# Patient Record
Sex: Female | Born: 1945 | Race: White | Hispanic: No | Smoking: Former smoker
Health system: Southern US, Community
[De-identification: ages and names within clinical notes are randomized; demographics above are authoritative.]

## PROBLEM LIST (undated history)

## (undated) DIAGNOSIS — M810 Age-related osteoporosis without current pathological fracture: Secondary | ICD-10-CM

## (undated) DIAGNOSIS — F419 Anxiety disorder, unspecified: Secondary | ICD-10-CM

## (undated) DIAGNOSIS — H269 Unspecified cataract: Secondary | ICD-10-CM

## (undated) DIAGNOSIS — I10 Essential (primary) hypertension: Secondary | ICD-10-CM

## (undated) DIAGNOSIS — R011 Cardiac murmur, unspecified: Secondary | ICD-10-CM

## (undated) DIAGNOSIS — G500 Trigeminal neuralgia: Secondary | ICD-10-CM

## (undated) DIAGNOSIS — K219 Gastro-esophageal reflux disease without esophagitis: Secondary | ICD-10-CM

## (undated) DIAGNOSIS — T7840XA Allergy, unspecified, initial encounter: Secondary | ICD-10-CM

## (undated) DIAGNOSIS — Z5189 Encounter for other specified aftercare: Secondary | ICD-10-CM

## (undated) HISTORY — PX: TONSILECTOMY, ADENOIDECTOMY, BILATERAL MYRINGOTOMY AND TUBES: SHX2538

## (undated) HISTORY — DX: Unspecified cataract: H26.9

## (undated) HISTORY — DX: Anxiety disorder, unspecified: F41.9

## (undated) HISTORY — PX: EYE SURGERY: SHX253

## (undated) HISTORY — DX: Age-related osteoporosis without current pathological fracture: M81.0

## (undated) HISTORY — PX: COSMETIC SURGERY: SHX468

## (undated) HISTORY — DX: Allergy, unspecified, initial encounter: T78.40XA

## (undated) HISTORY — PX: BREAST SURGERY: SHX581

## (undated) HISTORY — DX: Essential (primary) hypertension: I10

## (undated) HISTORY — DX: Cardiac murmur, unspecified: R01.1

## (undated) HISTORY — DX: Trigeminal neuralgia: G50.0

## (undated) HISTORY — DX: Gastro-esophageal reflux disease without esophagitis: K21.9

## (undated) HISTORY — DX: Encounter for other specified aftercare: Z51.89

---

## 1978-07-15 HISTORY — PX: NOSE SURGERY: SHX723

## 1980-07-15 DIAGNOSIS — Z5189 Encounter for other specified aftercare: Secondary | ICD-10-CM

## 1980-07-15 HISTORY — DX: Encounter for other specified aftercare: Z51.89

## 1996-07-15 DIAGNOSIS — G500 Trigeminal neuralgia: Secondary | ICD-10-CM

## 1996-07-15 HISTORY — DX: Trigeminal neuralgia: G50.0

## 1997-04-06 DIAGNOSIS — D229 Melanocytic nevi, unspecified: Secondary | ICD-10-CM

## 1997-04-06 HISTORY — DX: Melanocytic nevi, unspecified: D22.9

## 1998-07-03 ENCOUNTER — Other Ambulatory Visit: Admission: RE | Admit: 1998-07-03 | Discharge: 1998-07-03 | Payer: Self-pay | Admitting: *Deleted

## 1998-07-15 DIAGNOSIS — I10 Essential (primary) hypertension: Secondary | ICD-10-CM

## 1998-07-15 HISTORY — DX: Essential (primary) hypertension: I10

## 1999-07-02 ENCOUNTER — Other Ambulatory Visit: Admission: RE | Admit: 1999-07-02 | Discharge: 1999-07-02 | Payer: Self-pay | Admitting: *Deleted

## 2000-07-09 ENCOUNTER — Other Ambulatory Visit: Admission: RE | Admit: 2000-07-09 | Discharge: 2000-07-09 | Payer: Self-pay | Admitting: *Deleted

## 2002-07-13 ENCOUNTER — Other Ambulatory Visit: Admission: RE | Admit: 2002-07-13 | Discharge: 2002-07-13 | Payer: Self-pay | Admitting: Gynecology

## 2003-07-29 ENCOUNTER — Other Ambulatory Visit: Admission: RE | Admit: 2003-07-29 | Discharge: 2003-07-29 | Payer: Self-pay | Admitting: Gynecology

## 2004-09-26 ENCOUNTER — Encounter (INDEPENDENT_AMBULATORY_CARE_PROVIDER_SITE_OTHER): Payer: Self-pay | Admitting: Specialist

## 2004-09-26 ENCOUNTER — Ambulatory Visit (HOSPITAL_BASED_OUTPATIENT_CLINIC_OR_DEPARTMENT_OTHER): Admission: RE | Admit: 2004-09-26 | Discharge: 2004-09-26 | Payer: Self-pay | Admitting: Gynecology

## 2004-09-26 ENCOUNTER — Ambulatory Visit (HOSPITAL_COMMUNITY): Admission: RE | Admit: 2004-09-26 | Discharge: 2004-09-26 | Payer: Self-pay | Admitting: Gynecology

## 2005-01-16 ENCOUNTER — Ambulatory Visit (HOSPITAL_BASED_OUTPATIENT_CLINIC_OR_DEPARTMENT_OTHER): Admission: RE | Admit: 2005-01-16 | Discharge: 2005-01-16 | Payer: Self-pay | Admitting: Gynecology

## 2005-01-16 ENCOUNTER — Encounter (INDEPENDENT_AMBULATORY_CARE_PROVIDER_SITE_OTHER): Payer: Self-pay | Admitting: Specialist

## 2005-01-16 ENCOUNTER — Observation Stay (HOSPITAL_COMMUNITY): Admission: AD | Admit: 2005-01-16 | Discharge: 2005-01-17 | Payer: Self-pay | Admitting: Gynecology

## 2005-07-15 HISTORY — PX: ABDOMINAL HYSTERECTOMY: SHX81

## 2006-09-10 ENCOUNTER — Other Ambulatory Visit: Admission: RE | Admit: 2006-09-10 | Discharge: 2006-09-10 | Payer: Self-pay | Admitting: Family Medicine

## 2007-11-23 ENCOUNTER — Encounter: Admission: RE | Admit: 2007-11-23 | Discharge: 2007-11-23 | Payer: Self-pay | Admitting: Family Medicine

## 2008-03-06 ENCOUNTER — Emergency Department (HOSPITAL_COMMUNITY): Admission: EM | Admit: 2008-03-06 | Discharge: 2008-03-06 | Payer: Self-pay | Admitting: Emergency Medicine

## 2008-03-13 ENCOUNTER — Encounter: Admission: RE | Admit: 2008-03-13 | Discharge: 2008-03-13 | Payer: Self-pay | Admitting: Neurosurgery

## 2008-03-15 ENCOUNTER — Ambulatory Visit (HOSPITAL_COMMUNITY): Admission: RE | Admit: 2008-03-15 | Discharge: 2008-03-15 | Payer: Self-pay | Admitting: Neurosurgery

## 2010-11-27 NOTE — Op Note (Signed)
Lacey Burke, Lacey Burke                 ACCOUNT NO.:  [id number]   MEDICAL RECORD NO.:  [id number]          PATIENT TYPE:  AMB   LOCATION:  SDS                          FACILITY:  [facility]   PHYSICIAN:  Payton Doughty, M.D.      DATE OF BIRTH:  08-01-1945   DATE OF PROCEDURE:  03/15/2008  DATE OF DISCHARGE:                               OPERATIVE REPORT   ANESTHESIA:  Light sedation.  With anesthesia complications, we were  unable to localize foramina ovale.   PREOPERATIVE DIAGNOSIS:  Trigeminal neuralgia on the right side.   POSTOPERATIVE DIAGNOSIS:  Trigeminal neuralgia on the right side.   PROCEDURE:  Attempted percutaneous rhizotomy.   COMPLICATIONS:  Inability to localize the foramen ovale.   BODY OF TEXT:  This is a 65 year old lady with right V2 and V3 tic, who  has had pain in her upper and lower jaw, was taken to operating room,  had fluoro placed and under fluoroscopic guidance, the foramen ovale was  attempted to be entered with needle.  It was not possible to visualize  the foramen ovale, and it was felt blind stab was unsafe.  The procedure  was therefore terminated.  Further operative options such as  microvascular decompression or gamma knife radiation will be discussed  with the patient.   .           ______________________________  Payton Doughty, M.D.     MWR/MEDQ  D:  03/15/2008  T:  03/16/2008  Job:  [id number]

## 2010-11-30 NOTE — Op Note (Signed)
Lacey Burke, Lacey Burke                 ACCOUNT NO.:  [id number]   MEDICAL RECORD NO.:  [id number]          PATIENT TYPE:  AMB   LOCATION:  NESC                         FACILITY:  [facility]   PHYSICIAN:  Ivor Costa. Farrel Gobble, M.D. DATE OF BIRTH:  02/25/1946   DATE OF PROCEDURE:  09/26/2004  DATE OF DISCHARGE:                                 OPERATIVE REPORT   PREOPERATIVE DIAGNOSIS:  Postmenopausal bleeding.   POSTOPERATIVE DIAGNOSIS:  Postmenopausal bleeding.   PROCEDURES:  1.  Fractional dilatation and curettage.  2.  Hysteroscopy.   SURGEON:  Ivor Costa. Farrel Gobble, M.D.   ANESTHESIA:  MAC with a paracervical block.   ESTIMATED BLOOD LOSS:  Minimal.   IN AND OUT DEFICIT:  3% Sorbitol solution was less than 40 mL.   FINDINGS:  The uterus was anteverted, sounding 7 cm.  There was a right  sidewall defect.  The cavity was otherwise unremarkable and smooth.  Pathology was endometrial curettings, endocervical curettings and uterine  contents.   DESCRIPTION OF PROCEDURE:  The patient was taken to the operating room.  MAC  anesthesia was induced.  Placed in the dorsolithotomy position.  Prepped and  draped in the usual sterile fashion.  A bimanual exam was performed and then  a bivalve speculum was placed in the vagina.  Prior to prepping, the  Laminaria placed the night before was removed.  The cervix was visualized  and stabilized with a single-tooth tenaculum.  Endocervical curettings were  then performed and sent in as a separate specimen.  Paracervical block was  then placed and the cervix was then dilated up to #31 [place].  Afterwards,  the hysteroscope was advanced through the cervix towards the fundus.  There  was polypoid-appearing material in the right-hand side that was removed and  sent as a single specimen.  The remainder of the cavity was unremarkable.  The endometrial curettings were then performed.  The hysteroscope was  replaced in the cavity.  Again no defects were appreciated  and slowly  retracted through the cervix, which was also unremarkable.  The instruments  were then removed.  The patient tolerated the procedure well and then was  transferred back to the PACU in stable condition.     THL/MEDQ  D:  09/26/2004  T:  09/26/2004  Job:  [id number]

## 2010-11-30 NOTE — H&P (Signed)
NAMELORIANN, BOSSERMAN                 ACCOUNT NO.:  [id number]   MEDICAL RECORD NO.:  [id number]          PATIENT TYPE:  AMB   LOCATION:  NESC                         FACILITY:  [facility]   PHYSICIAN:  Ivor Costa. Farrel Gobble, M.D. DATE OF BIRTH:  10-15-1945   DATE OF ADMISSION:  DATE OF DISCHARGE:                                HISTORY & PHYSICAL   CHIEF COMPLAINT:  Postmenopausal bleeding.   HISTORY OF PRESENT ILLNESS:  The patient is a 65 year old postmenopausal  woman who entered menopause in 2004 who now has had three episodes of  postmenopausal bleeding.  Initially was seen in our office in August 2005,  at which point she had had some breast tenderness and light spotting that  had begun that day.  She had a biopsy done which showed proliferative  endometrium with breakdown but no evidence of hyperplasia.  She was noted to  have a thickened endometrium and was treated with progesterone for 3 months.  Despite progesterone therapy, the patient did not have any vaginal bleeding  but her lining thinned out to 2.2.  The patient, unfortunately, has had two  other episodes of vaginal bleeding not associated with Provera.  Her lining  has increased to 3.3.  She does have a small intramural myoma, the impact of  which on the lining was unclear.  Based on that, the patient would like to  proceed with a more definitive evaluation of her postmenopausal bleeding and  is scheduled for a D&C hysteroscopy.   PAST OB/GYN HISTORY:  1.  Spontaneous menopause.  2.  No history of hormone replacement.  3.  She has had two vaginal deliveries.  4.  She has had regular Pap smears.  5.  No menopausal symptoms.   PAST MEDICAL HISTORY:  Negative.   PAST SURGICAL HISTORY:  Mammoplasty in 1993.   MEDICATIONS:  Paxil, MiraLax, metoprolol, plus multiple vitamins.   ALLERGIES:  Negative.   SOCIAL HISTORY:  She is not married.  Social alcohol.  Regular exercise.   PHYSICAL EXAMINATION:  GENERAL:  She is  well-appearing, in no acute  distress.  HEART:  Regular rate.  LUNGS:  Clear to auscultation.  ABDOMEN:  Soft, nontender.  GYNECOLOGIC:  Normal external genitalia. BUS negative.  Cervix is without  gross lesions.  Uterus is mobile, nontender, as were the adnexa.  Rectovaginal exam was deferred.   ASSESSMENT:  Recurrent postmenopausal bleeding with a negative evaluation to  date who presents now for [facility] hysteroscopy.  All questions were addressed.  She will have a laminaria placed the day before.      THL/MEDQ  D:  09/25/2004  T:  09/25/2004  Job:  [id number]

## 2010-11-30 NOTE — H&P (Signed)
Lacey Burke, Lacey Burke                 ACCOUNT NO.:  [id number]   MEDICAL RECORD NO.:  [id number]          PATIENT TYPE:  AMB   LOCATION:  NESC                         FACILITY:  [facility]   PHYSICIAN:  Ivor Costa. Farrel Gobble, M.D. DATE OF BIRTH:  Dec 01, 1945   DATE OF ADMISSION:  DATE OF DISCHARGE:                                HISTORY & PHYSICAL   CHIEF COMPLAINT:  Postmenopausal bleeding.   HISTORY OF PRESENT ILLNESS:  Patient is a 65 year old postmenopausal woman  who entered menopause in 2004, who now has had her fourth episode of  postmenopausal bleeding.  She was initially seen in our office in August,  2005, at which point she had some breast tenderness and some light spotting  that had begun that day.  She had a biopsy which showed proliferative  endometrium with breakdown but no evidence of hyperplasia.  She was noticed  to have a thickened endometrium and was treated with progesterone for three  months.  Despite progesterone therapy, the patient did not have any vaginal  bleeding but her lining thinned out to 2.2.  She unfortunately had two other  episodes of vaginal bleeding, and her lining had increased to 3.3.  That  prompted her to undergo D&C hysteroscopy, which was done in March, 2006,  which showed a benign necrotic polyp.  The patient did well until May, 2006  when she again presented to the office with another episode of  postmenopausal bleeding.  As the patient was status post recent evaluation  of her uterus, she was offered either expected management versus definitive  surgery.  The patient at this point elected to have definitive surgery in  the form of a laparoscopic-assisted vaginal hysterectomy with laparoscopic  bilateral salpingo-oophorectomy because of risk of ovarian cancer.  She is  Ashkenazi Jewish.   PAST OB/GYN HISTORY:  She went into spontaneous menopause, as mentioned, in  2004.  She has no history of hormone replacement.  She has had two vaginal  deliveries.   Regular Pap smears.  No menopausal symptoms.   PAST MEDICAL HISTORY:  Negative.   PAST SURGICAL HISTORY:  Mammoplasty in 2003 as well as hysteroscopies,  mentioned above.   MEDICATIONS:  She is on Paxil, MiraLax, metoprolol, and multivitamin.   ALLERGIES:  Negative.   SOCIAL HISTORY:  Not married.  Social alcohol.  Regular exercise.   PHYSICAL EXAMINATION:  GENERAL:  She is well-appearing in no acute distress.  LUNGS:  Clear to auscultation.  HEART:  Regular rate.  ABDOMEN:  Soft and nontender.  GENITOURINARY:  She has normal external genitalia.  BUS is negative.  Cervix  is without gross lesions.  Uterus is mobile and nontender.  The adnexal and  rectovaginal exam was deferred.   ASSESSMENT:  Recurrent postmenopausal bleeding.   For definitive surgery at this point.  All questions were addressed.  She  will present on the morning of July 5 for surgery.       THL/MEDQ  D:  01/14/2005  T:  01/14/2005  Job:  [id number]

## 2010-11-30 NOTE — Op Note (Signed)
NAMEPHELAN, GOERS                 ACCOUNT NO.:  [id number]   MEDICAL RECORD NO.:  [id number]          PATIENT TYPE:  AMB   LOCATION:  NESC                         FACILITY:  [facility]   PHYSICIAN:  Ivor Costa. Farrel Gobble, M.D. DATE OF BIRTH:  August 15, 1945   DATE OF PROCEDURE:  01/16/2005  DATE OF DISCHARGE:                                 OPERATIVE REPORT   PREOPERATIVE DIAGNOSES:  1.  Postmenopausal bleeding, recurrent.  2.  Uterine fibroids.   POSTOPERATIVE DIAGNOSES:  1.  Postmenopausal bleeding, recurrent.  2.  Uterine fibroids.   PROCEDURE:  Laparoscopic-assisted vaginal hysterectomy with bilateral  salpingo-oophorectomies.   SURGEON:  Ivor Costa. Farrel Gobble, M.D.   ASSISTANTMarcial Pacas P. Fontaine, M.D.   ANESTHESIA:  General.   IV FLUIDS:  1500 cc lactated Ringer's.   ESTIMATED BLOOD LOSS:  50 cc.   URINE OUTPUT:  500 cc clear urine.   FINDINGS:  A subserosal uterine fibroid.  There was right ovarian  enlargement without excrescences.  Normal tubes and left ovary.   COMPLICATIONS:  None.   PATHOLOGY:  Uterus, cervix, tubes, and ovaries.   PROCEDURE:  The patient was taken to the operating room and general  anesthesia was induced.  She was placed in the dorsal lithotomy position and  prepped and draped in the usual sterile fashion.  A bivalve speculum was  placed in the vagina and the cervix was visualized and the uterine  manipulator was placed, after which gloves were changed and attention was  turned to the abdomen where an infraumbilical incision was made with the  scalpel, through which the Veress needle was inserted.  Free flow fluid was  noted.  Opening pressure was 7.  A pneumoperitoneum was then created until  tympany was appreciated by the liver, after which a #10-11 OptiView trocar  was inserted through the infraumbilical port.  Placement in the abdomen was  confirmed.  Two 5-mm ports were made laterally under direct visualization  using these ports.  The pelvis was  inspected.  The patient was placed in  Trendelenburg position.  The IP was identified.  The ureter was identified  markedly inferior bilaterally.  The IP was then cauterized and transected.  The posterior leaf of the broad ligament was similarly cauterized and  transected, the dissection carried through to the round ligaments which were  similarly cauterized and transected.  The anterior leaf of the broad  ligament was then incised sharply, elevated, and the bladder flap was then  created.  This was performed bilaterally.  Because of good visualization, we  were able to take some of the uterine vessels on the left-hand side, again  with cautery and transection.   The surgery was then converted to the vaginal portion.  Her legs were  elevated with careful attention to avoid over-flexion onto the hips.  The  uterine manipulator was removed.  The sterile weighted speculum was placed  in the posterior vagina.  The cervix was grabbed with a single-tooth  tenaculum.  The vaginal mucosa was then injected circumferentially with 1%  lidocaine with epinephrine.  The vaginal  mucosa was scored  circumferentially.  The dissection was carried through anteriorly and  anterior colpotomy was performed.  Similarly, the vaginal mucosa was  dissected inferiorly and the posterior colpotomy was similarly formed.  The  long sterile weighted speculum was then placed in the vagina.  The  uterosacral ligaments were then clamped, transected, and suture ligated with  0 Vicryl.  The cardinal ligaments were similarly transected and sutured with  0 Vicryl.  The dissection carried through until the uterines on the right  were achieved, at which point the specimen was delivered through the vagina.  A sterile sponge was placed in the vagina.  The incision was inspected.  The  short sterile weighted speculum was placed.  The pedicles were felt to be  hemostatic.  The posterior peritoneum was then plicated to the vagina  from  uterosacral ligament to uterosacral ligament.  The vagina was then plicated  anteriorly to posteriorly with 0 Vicryl.  The vagina was irrigated and felt  to be hemostatic.  The pneumoperitoneum was then recreated from above.  A  small area of bleeding on the peritoneum was identified and treated after  the pelvis was irrigated.  The pneumoperitoneum was slightly released in  order to confirm that we had achieved hemostasis in the pelvis, which was  assured.  The pelvis continued to be irrigated.  The ports were then removed  under direct visualization.  The infraumbilical fascia was closed with 0  Vicryl with a figure-of-eight.  The infraumbilical skin was closed with 3-0  plain.  All three ports were injected with a total of 10% of 0.25% Marcaine  solution.  The lower ports were reapproximated with tincture of benzoin with  Steri-Strips.  The patient tolerated the procedure well.  She received  cefazolin and gentamicin intraoperatively for mitral valve prolapse  prophylaxis.       THL/MEDQ  D:  01/16/2005  T:  01/16/2005  Job:  [id number]

## 2010-11-30 NOTE — Discharge Summary (Signed)
Lacey Burke, VESSEL                 ACCOUNT NO.:  [id number]   MEDICAL RECORD NO.:  [id number]          PATIENT TYPE:  INP   LOCATION:  1615                         [facility]ACILITY:  [facility]   PHYSICIAN:  Ivor Costa. Farrel Gobble, M.D. DATE OF BIRTH:  02-16-1946   DATE OF ADMISSION:  01/16/2005  DATE OF DISCHARGE:  01/17/2005                                 DISCHARGE SUMMARY   PRINCIPAL DIAGNOSIS:  Recurrent postmenopausal bleeding.   PRINCIPAL PROCEDURE:  Laparoscopic assisted vaginal hysterectomy.   ADDITIONAL PROCEDURE:  Bilateral salpingo-oophorectomy.   HOSPITAL COURSE:  Refer to the dictated H&P. The patient presented the  morning of January 16, 2005 and underwent a laparoscopic assisted vaginal  hysterectomy under general anesthesia with an estimated blood loss of  approximately 50 cc. The patient tolerated the procedure well. She was  extubated in the OR, transferred to the PACU, and then to the postoperative  floor in due fashion. Her postoperative course was unremarkable. At her  postoperative check, the patient was ambulating in the room, voiding freely,  and tolerating regular diet. By the morning of postoperative day #1, she was  ready to go home. She was without any complaints. Her pain was well  controlled. Her heart was regular rate. Her lungs were clear to  auscultation. Abdomen was soft and nontender with bowel sounds. Incisions  were intact. Extremities were nontender. Her postoperative labs:  Her  hemoglobin was 12.6, hematocrit 36.6, platelets 282, white count of 10.6.   The patient was discharged home in stable condition with instructions to use  over-the-counter Motrin as needed. She had been given a prescription for  Lortab earlier. She was instructed to follow up in the office in two weeks.       THL/MEDQ  D:  01/17/2005  T:  01/17/2005  Job:  [id number]

## 2010-12-17 ENCOUNTER — Other Ambulatory Visit: Payer: Self-pay | Admitting: Family Medicine

## 2010-12-17 DIAGNOSIS — I10 Essential (primary) hypertension: Secondary | ICD-10-CM

## 2010-12-26 ENCOUNTER — Ambulatory Visit
Admission: RE | Admit: 2010-12-26 | Discharge: 2010-12-26 | Disposition: A | Discharge status: Home / Self Care | Payer: BC Managed Care – PPO | Source: Ambulatory Visit | Attending: Family Medicine | Admitting: Family Medicine

## 2010-12-26 DIAGNOSIS — I10 Essential (primary) hypertension: Secondary | ICD-10-CM

## 2011-07-02 ENCOUNTER — Ambulatory Visit (INDEPENDENT_AMBULATORY_CARE_PROVIDER_SITE_OTHER): Payer: BC Managed Care – PPO

## 2011-07-02 DIAGNOSIS — R7401 Elevation of levels of liver transaminase levels: Secondary | ICD-10-CM

## 2011-07-02 DIAGNOSIS — S92309A Fracture of unspecified metatarsal bone(s), unspecified foot, initial encounter for closed fracture: Secondary | ICD-10-CM

## 2011-07-12 ENCOUNTER — Other Ambulatory Visit: Payer: Self-pay | Admitting: Internal Medicine

## 2011-07-12 DIAGNOSIS — R7989 Other specified abnormal findings of blood chemistry: Secondary | ICD-10-CM

## 2011-07-15 ENCOUNTER — Other Ambulatory Visit: Payer: Self-pay | Admitting: Internal Medicine

## 2011-07-15 ENCOUNTER — Ambulatory Visit
Admission: RE | Admit: 2011-07-15 | Discharge: 2011-07-15 | Disposition: A | Discharge status: Home / Self Care | Payer: BC Managed Care – PPO | Source: Ambulatory Visit | Attending: Internal Medicine | Admitting: Internal Medicine

## 2011-07-15 DIAGNOSIS — R7989 Other specified abnormal findings of blood chemistry: Secondary | ICD-10-CM

## 2011-08-02 ENCOUNTER — Ambulatory Visit (INDEPENDENT_AMBULATORY_CARE_PROVIDER_SITE_OTHER): Payer: BC Managed Care – PPO

## 2011-08-02 DIAGNOSIS — S92309A Fracture of unspecified metatarsal bone(s), unspecified foot, initial encounter for closed fracture: Secondary | ICD-10-CM

## 2011-08-16 ENCOUNTER — Telehealth: Payer: Self-pay | Admitting: Physician Assistant

## 2011-08-16 NOTE — Telephone Encounter (Signed)
Lacey Burke,  Patients chart is in your box per your request

## 2011-08-16 NOTE — Telephone Encounter (Signed)
Message copied by Kerney Elbe on Fri Aug 16, 2011 11:41 AM ------      Message from: Jonita Albee      Created: Wed Aug 14, 2011  4:03 PM       Please pull chart , put in my box.

## 2011-08-17 NOTE — Telephone Encounter (Signed)
Put this chart in Dr. Ernestene Mention box, per his request

## 2011-09-10 ENCOUNTER — Ambulatory Visit (INDEPENDENT_AMBULATORY_CARE_PROVIDER_SITE_OTHER): Payer: BC Managed Care – PPO | Admitting: Internal Medicine

## 2011-09-10 VITALS — BP 142/96 | HR 64 | Temp 98.1°F | Resp 16 | Ht 60.25 in | Wt 110.0 lb

## 2011-09-10 DIAGNOSIS — R7989 Other specified abnormal findings of blood chemistry: Secondary | ICD-10-CM | POA: Insufficient documentation

## 2011-09-10 DIAGNOSIS — R7401 Elevation of levels of liver transaminase levels: Secondary | ICD-10-CM

## 2011-09-10 DIAGNOSIS — I10 Essential (primary) hypertension: Secondary | ICD-10-CM

## 2011-09-10 DIAGNOSIS — R748 Abnormal levels of other serum enzymes: Secondary | ICD-10-CM

## 2011-09-10 DIAGNOSIS — R945 Abnormal results of liver function studies: Secondary | ICD-10-CM | POA: Insufficient documentation

## 2011-09-10 DIAGNOSIS — G500 Trigeminal neuralgia: Secondary | ICD-10-CM | POA: Insufficient documentation

## 2011-09-10 LAB — HEPATIC FUNCTION PANEL
Bilirubin, Direct: 0.1 mg/dL (ref 0.0–0.3)
Indirect Bilirubin: 0.2 mg/dL (ref 0.0–0.9)
Total Bilirubin: 0.3 mg/dL (ref 0.3–1.2)

## 2011-09-10 LAB — POCT CBC
Hemoglobin: 14.1 g/dL (ref 12.2–16.2)
Lymph, poc: 2.3 (ref 0.6–3.4)
MCH, POC: 30.2 pg (ref 27–31.2)
MCHC: 33.1 g/dL (ref 31.8–35.4)
MID (cbc): 0.5 (ref 0–0.9)
MPV: 8.9 fL (ref 0–99.8)
POC Granulocyte: 4.8 (ref 2–6.9)
POC LYMPH PERCENT: 30.5 %L (ref 10–50)
POC MID %: 6.1 %M (ref 0–12)
RDW, POC: 12.4 %
WBC: 7.5 10*3/uL (ref 4.6–10.2)

## 2011-09-10 LAB — IRON AND TIBC: TIBC: 343 ug/dL (ref 250–470)

## 2011-09-10 NOTE — Progress Notes (Signed)
  Subjective:    Patient ID: Lacey Burke, female    DOB: [birth year], 66 y.o.   MRN: [id number]  HPI Lacey Burke is here for follow up of L 5th metatarsal fracture sustained 06/07/2011.  She feels 100% better and has even started to run lightly without any pain.  She does not want a final xray today.  Lacey Burke also needs to repeat LFTs as we have been following a mild elevation of ALT and AST for ~ 6 months.  She is held ETOH and other meds and is hoping that they will be normal.  She also mentions that she has had thick formed green mucous mainly from left nasal passage but does not feel ill.  She did have a HA yesterday am but none today.   Review of Systems  Constitutional: Negative for fever, chills and fatigue.  HENT: Positive for congestion. Negative for rhinorrhea, postnasal drip and sinus pressure.   Respiratory: Negative for cough.   Gastrointestinal: Negative for nausea, vomiting and abdominal pain.  Musculoskeletal: Negative for myalgias, joint swelling and arthralgias.       Objective:   Physical Exam  Constitutional: She appears well-developed and well-nourished.  HENT:  Right Ear: Tympanic membrane normal.  Left Ear: Tympanic membrane normal.  Nose: No mucosal edema or rhinorrhea. Right sinus exhibits no maxillary sinus tenderness and no frontal sinus tenderness. Left sinus exhibits no maxillary sinus tenderness and no frontal sinus tenderness.  Mouth/Throat: Oropharynx is clear and moist.  Musculoskeletal:       Left foot: Normal. She exhibits no tenderness, no bony tenderness and no swelling.   Pt is abl to balance on left foot for 15 seconds without any pain.  Filed Vitals:   09/10/11 1625  BP: 142/96  Pulse: 64  Temp: 98.1 F (36.7 C)  Resp: 16         Assessment & Plan:   Fracture, Left 5th Metatarsal  Pt declines final xray today.  Reviewed need for final baseline but pt feels that since she is absolutely pain free that she will defer.     Abnormal  Liver Function Tests  Repeat LFTs today.  Also check FE/TIBC, ferritin.  Will draw enough blood to add on AMA, ANA, cooper, PT, and alpha 1 antitrypsin if elevation remains.  Will have her return for PTT since it is frozen.  Sinusitis  Increase use of saline NS.  If no resolution of symptoms and labs are normal consider antibiotic.

## 2011-11-05 ENCOUNTER — Other Ambulatory Visit: Payer: Self-pay | Admitting: Physician Assistant

## 2011-11-06 ENCOUNTER — Telehealth: Payer: Self-pay

## 2011-11-06 MED ORDER — CARBAMAZEPINE 200 MG PO TABS: 200.0000 mg | ORAL_TABLET | Freq: Two times a day (BID) | ORAL | Status: DC

## 2011-11-06 MED ORDER — EPINEPHRINE 0.3 MG/0.3ML IJ DEVI: 0.3000 mg | Freq: Once | INTRAMUSCULAR | Status: DC

## 2011-11-06 MED ORDER — CARBAMAZEPINE 200 MG PO TABS: 200.0000 mg | ORAL_TABLET | ORAL | Status: DC

## 2011-11-06 NOTE — Telephone Encounter (Signed)
Advise pt that rx sent into pharmacy.

## 2011-11-06 NOTE — Telephone Encounter (Signed)
Pt LM on my VM asking for Rx for an Epi-pen and RF on her tegretol. She stated she is a pt of [facility]. Helmut Muster do you want to Rx these for pt?

## 2011-11-06 NOTE — Telephone Encounter (Signed)
Pt is allergic to bees.  She states that she got it from [place] a few years ago.  Chart is at nurses station for review [place] [id number]

## 2011-11-06 NOTE — Telephone Encounter (Signed)
Ok to RF tegretol.  Why does she need Epi Pen? I don't recall her having a history that needed this.  However, I am happy to send that in if she has an allergy. Let me know

## 2011-11-06 NOTE — Telephone Encounter (Signed)
Tell pt epi-pen sent to pharmacy and refill on Tegretol per her request.

## 2011-11-06 NOTE — Telephone Encounter (Signed)
LMOM to call back

## 2011-12-05 ENCOUNTER — Telehealth: Payer: Self-pay

## 2011-12-05 NOTE — Telephone Encounter (Signed)
PATIENT NEEDS Lacey Burke TO HAVE HER TEGRIDOL REFILLED

## 2011-12-06 MED ORDER — CARBAMAZEPINE 200 MG PO TABS: 200.0000 mg | ORAL_TABLET | Freq: Two times a day (BID) | ORAL | Status: DC

## 2011-12-06 NOTE — Telephone Encounter (Signed)
Called pt LMOM to let pt know Rx sent in

## 2011-12-06 NOTE — Telephone Encounter (Signed)
Rx sent, please advise patient

## 2011-12-15 ENCOUNTER — Other Ambulatory Visit: Payer: Self-pay | Admitting: Family Medicine

## 2012-02-01 ENCOUNTER — Ambulatory Visit (INDEPENDENT_AMBULATORY_CARE_PROVIDER_SITE_OTHER): Payer: BC Managed Care – PPO | Admitting: Physician Assistant

## 2012-02-01 VITALS — BP 118/76 | HR 76 | Temp 98.2°F | Resp 16 | Ht 60.0 in | Wt 106.8 lb

## 2012-02-01 DIAGNOSIS — R7402 Elevation of levels of lactic acid dehydrogenase (LDH): Secondary | ICD-10-CM | POA: Diagnosis not present

## 2012-02-01 DIAGNOSIS — M810 Age-related osteoporosis without current pathological fracture: Secondary | ICD-10-CM

## 2012-02-01 DIAGNOSIS — F411 Generalized anxiety disorder: Secondary | ICD-10-CM

## 2012-02-01 DIAGNOSIS — R748 Abnormal levels of other serum enzymes: Secondary | ICD-10-CM

## 2012-02-01 DIAGNOSIS — R7401 Elevation of levels of liver transaminase levels: Secondary | ICD-10-CM

## 2012-02-01 DIAGNOSIS — F419 Anxiety disorder, unspecified: Secondary | ICD-10-CM | POA: Insufficient documentation

## 2012-02-01 LAB — COMPREHENSIVE METABOLIC PANEL
ALT: 32 U/L (ref 0–35)
AST: 25 U/L (ref 0–37)
Albumin: 4.6 g/dL (ref 3.5–5.2)
BUN: 17 mg/dL (ref 6–23)
CO2: 28 mEq/L (ref 19–32)
Calcium: 9.6 mg/dL (ref 8.4–10.5)
Chloride: 104 mEq/L (ref 96–112)
Potassium: 4.5 mEq/L (ref 3.5–5.3)

## 2012-02-01 LAB — POCT CBC
Lymph, poc: 1.7 (ref 0.6–3.4)
MCH, POC: 30.5 pg (ref 27–31.2)
MCHC: 32.2 g/dL (ref 31.8–35.4)
MID (cbc): 0.4 (ref 0–0.9)
MPV: 8.2 fL (ref 0–99.8)
POC MID %: 4.4 %M (ref 0–12)
Platelet Count, POC: 356 10*3/uL (ref 142–424)
WBC: 8.7 10*3/uL (ref 4.6–10.2)

## 2012-02-01 LAB — LIPID PANEL
Cholesterol: 214 mg/dL — ABNORMAL HIGH (ref 0–200)
HDL: 93 mg/dL (ref >39)

## 2012-02-01 NOTE — Progress Notes (Signed)
  Subjective:    Patient ID: Lacey Burke, female    DOB: 1946/04/13, 66 y.o.   MRN: [id number]  HPI Anaiza comes in today to discuss future care.  She has been made aware that I am going to be leaving and wanted to discuss her labs and care.  She is doing very well and has no new concerns.  Her last LFT's were 2/13 and were normal.  She feels that the Tegretol is the cause for the elevation.  She had a "flare" of trigeminal neuralgia in May and has been instructed in the past to increase her dose.  She did this and has since tapered back down to 1 a day.  She is current with her GYN exams and has a bone density test scheduled through GYN.  Her anxiety has been in good control.  She has been able to maintain her exercise and this always helps her.    Review of Systems As noted in HPI, otherwise negative     Objective:   Physical Exam  Constitutional: She is oriented to person, place, and time. She appears well-developed and well-nourished.  Eyes: No scleral icterus.  Cardiovascular: Normal rate and regular rhythm.   Neurological: She is alert and oriented to person, place, and time.  Skin: Skin is warm.     Results for orders placed in visit on 02/01/12  POCT CBC      Component Value Range   WBC 8.7  4.6 - 10.2 K/uL   Lymph, poc 1.7  0.6 - 3.4   POC LYMPH PERCENT 20.0  10 - 50 %L   MID (cbc) 0.4  0 - 0.9   POC MID % 4.4  0 - 12 %M   POC Granulocyte 6.6  2 - 6.9   Granulocyte percent 75.6  37 - 80 %G   RBC 4.79  4.04 - 5.48 M/uL   Hemoglobin 14.6  12.2 - 16.2 g/dL   HCT, POC 40.9  81.1 - 47.9 %   MCV 94.7  80 - 97 fL   MCH, POC 30.5  27 - 31.2 pg   MCHC 32.2  31.8 - 35.4 g/dL   RDW, POC 91.4     Platelet Count, POC 356  142 - 424 K/uL   MPV 8.2  0 - 99.8 fL        Assessment & Plan:   1. Elevated liver enzymes  Comprehensive metabolic panel, Lipid panel, POCT CBC  2. Osteoporosis    3. Anxiety      Check CMET, Lipid We will make any adjustments based on  labs. Offered provider names within [facility] for future care. She will have CPE within 3 months although there is flexibility due to the transition.   I encouraged her to continue the lifestyle changes and habits she has made. I thanked her for allowing me to care for her all these years and wish her all the best.

## 2012-02-02 ENCOUNTER — Telehealth: Payer: Self-pay | Admitting: Physician Assistant

## 2012-02-02 NOTE — Telephone Encounter (Signed)
Notified of her lab results and reassured.

## 2012-02-22 ENCOUNTER — Ambulatory Visit (INDEPENDENT_AMBULATORY_CARE_PROVIDER_SITE_OTHER): Payer: BC Managed Care – PPO | Admitting: Family Medicine

## 2012-02-22 VITALS — BP 146/83 | HR 60 | Temp 97.6°F | Resp 12 | Ht 60.0 in | Wt 108.4 lb

## 2012-02-22 DIAGNOSIS — R1011 Right upper quadrant pain: Secondary | ICD-10-CM

## 2012-02-22 MED ORDER — MELOXICAM 7.5 MG PO TABS: 7.5000 mg | ORAL_TABLET | Freq: Every day | ORAL | Status: DC
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
## 2012-02-27 ENCOUNTER — Ambulatory Visit
Admission: RE | Admit: 2012-02-27 | Discharge: 2012-02-27 | Disposition: A | Discharge status: Home / Self Care | Payer: BC Managed Care – PPO | Source: Ambulatory Visit | Attending: Family Medicine | Admitting: Family Medicine

## 2012-02-27 DIAGNOSIS — R1011 Right upper quadrant pain: Secondary | ICD-10-CM

## 2012-03-03 ENCOUNTER — Encounter: Payer: Self-pay | Admitting: Physician Assistant

## 2012-03-20 ENCOUNTER — Telehealth: Payer: Self-pay

## 2012-03-20 MED ORDER — CARBAMAZEPINE 200 MG PO TABS: 200.0000 mg | ORAL_TABLET | Freq: Two times a day (BID) | ORAL | Status: DC

## 2012-03-20 NOTE — Telephone Encounter (Signed)
Pt would like to have a refill on tegretol. [phone]   Pharmacy: [facility] on [address]

## 2012-03-20 NOTE — Telephone Encounter (Signed)
Done

## 2012-03-22 NOTE — Telephone Encounter (Signed)
Pt did get rx for medication.    She would like for [place] to know that for her next refill she would like for her rx to say 2 tabs am  1 tab pm 2 tabs pm.

## 2012-03-23 ENCOUNTER — Telehealth: Payer: Self-pay | Admitting: *Deleted

## 2012-03-23 ENCOUNTER — Ambulatory Visit (INDEPENDENT_AMBULATORY_CARE_PROVIDER_SITE_OTHER): Payer: BC Managed Care – PPO | Admitting: Family Medicine

## 2012-03-23 VITALS — BP 142/82 | HR 92 | Temp 98.1°F | Resp 18 | Wt 111.0 lb

## 2012-03-23 DIAGNOSIS — Z79899 Other long term (current) drug therapy: Secondary | ICD-10-CM | POA: Diagnosis not present

## 2012-03-23 NOTE — Telephone Encounter (Signed)
Pt called and stated that she was having blurred vision in her right eye. Per Dr. Cleta Alberts this could be a possible side effect of the med. She has recently increased her Tegretol from tid to 2 qam 1 mid day, 2hs.  Advised pt that she needs to come in to discuss about dosing instead of just doing this on her own. Pt stated that she will probably be in this afternoon or sooner if she gets worse.

## 2012-03-23 NOTE — Progress Notes (Signed)
On Tegretol five tablets per day for the past week for the Trigeminal Neuralgia.  She's had this problem for 15 years, but over the years the pain has worsened.   She's had some breakthrough pain lately, hence the increased dose last week.  HEENT:  Normal except for early cataracts. Chest clear Heart regular, no murmur Abdomen No HSM  Assessment:  Breakthrough trigeminal neuralgia  Plan:  Drop back to 4 Tegretol per day Check CMET, if normal, add phenytoin 100 qhs.

## 2012-03-24 LAB — COMPREHENSIVE METABOLIC PANEL
ALT: 60 U/L — ABNORMAL HIGH (ref 0–35)
AST: 43 U/L — ABNORMAL HIGH (ref 0–37)
Albumin: 4.9 g/dL (ref 3.5–5.2)
Alkaline Phosphatase: 107 U/L (ref 39–117)
BUN: 11 mg/dL (ref 6–23)
CO2: 26 mEq/L (ref 19–32)
Calcium: 9.9 mg/dL (ref 8.4–10.5)
Chloride: 97 mEq/L (ref 96–112)
Creat: 0.57 mg/dL (ref 0.50–1.10)
Glucose, Bld: 96 mg/dL (ref 70–99)
Potassium: 5.3 mEq/L (ref 3.5–5.3)
Sodium: 132 mEq/L — ABNORMAL LOW (ref 135–145)
Total Bilirubin: 0.4 mg/dL (ref 0.3–1.2)
Total Protein: 7.1 g/dL (ref 6.0–8.3)

## 2012-03-26 ENCOUNTER — Telehealth: Payer: Self-pay | Admitting: Family Medicine

## 2012-03-26 NOTE — Telephone Encounter (Signed)
patient want to know if she should take the dilantin

## 2012-03-26 NOTE — Telephone Encounter (Signed)
Left message on phone

## 2012-03-26 NOTE — Telephone Encounter (Signed)
Message copied by Gerrianne Scale on Thu Mar 26, 2012  2:22 PM ------      Message from: Elvina Sidle      Created: Thu Mar 26, 2012  2:18 PM       Patient has abnormal lab values.  Probably related to recent increase in Tegretol dose so as we cut it back, labs should return to normal.  Recheck in 1 week.

## 2012-04-02 ENCOUNTER — Ambulatory Visit (INDEPENDENT_AMBULATORY_CARE_PROVIDER_SITE_OTHER): Payer: BC Managed Care – PPO | Admitting: Family Medicine

## 2012-04-02 ENCOUNTER — Other Ambulatory Visit: Payer: Self-pay | Admitting: Family Medicine

## 2012-04-02 VITALS — BP 130/80 | HR 66 | Temp 98.1°F | Resp 16 | Ht 60.0 in | Wt 109.0 lb

## 2012-04-02 DIAGNOSIS — R7989 Other specified abnormal findings of blood chemistry: Secondary | ICD-10-CM

## 2012-04-02 DIAGNOSIS — G500 Trigeminal neuralgia: Secondary | ICD-10-CM | POA: Diagnosis not present

## 2012-04-02 NOTE — Progress Notes (Signed)
[address]   [place], [place]  [address]   [phone]  Subjective:    Patient ID: Lacey Burke, female    DOB: Jan 14, 1946, 66 y.o.   MRN: [id number]  HPIThis 66 y.o. female presents for evaluation of :  1. Trigeminal Neuralgia:  Has suffered with worsening R facial pain since 11/2011; had increased to 5 Tegretol in past several weeks per her own desires.  Presented on 03/23/12 and underwent evaluation by Dr. Aaron Mose; s/p labs with elevated LFTs.  Dr. Elbert Ewings advised to decrease to Tegretol 4 tablets daily and added Dilantin qhs last week.  Pain is less some; no worsening pain.  Lives in fear of last episode which was severe.  Fasted on Saturday; later that night, worsened with eating that evening.  Careful to chew on L side; avoiding a lot of movement on R; certain movements of mouth will trigger sharp shooting pain.  Chronic issue for patient; 15-20 years duration.  Diagnosed by oral surgeon.  Called neurologist in past who recommended management by PCP.  Severe episode a few years ago that was very debilitating; has been taking one Tegretol out of fear and due to recurrent symptoms.    2. LFTs elevated at visit last week: due for repeat.  Does take Excedrin Migraine at times; rare alcohol intake.  Associated with increased dose of Tegretol due to worsening TN.  PCP: Kennedy Bucker, PA-C and now Dr. Milus Glazier.   Review of Systems  Constitutional: Negative for fever, chills and fatigue.  HENT: Negative for hearing loss, ear pain, congestion, sore throat, facial swelling, rhinorrhea, dental problem, postnasal drip and tinnitus.   Gastrointestinal: Negative for abdominal pain and abdominal distention.  Neurological: Negative for dizziness, tremors, syncope, facial asymmetry, speech difficulty, weakness, light-headedness, numbness and headaches.    No past medical history on file.  Past Surgical History  Procedure Date  . Breast surgery     Augmentation in 1970's/Implant removal 1990's  .  Abdominal hysterectomy 2007    Fibroids  . Tonsilectomy, adenoidectomy, bilateral myringotomy and tubes     Prior to Admission medications   Medication Sig Start Date End Date Taking? Authorizing Provider  Ascorbic Acid (VITAMIN C) 1000 MG tablet Take 1,000 mg by mouth daily.   Yes Historical Provider, MD  buPROPion (WELLBUTRIN XL) 300 MG 24 hr tablet Take 300 mg by mouth daily.   Yes Historical Provider, MD  Calcium-Vitamin D-Vitamin K 750-500-40 MG-UNT-MCG TABS Take by mouth daily.   Yes Historical Provider, MD  carbamazepine (TEGRETOL) 200 MG tablet Take 200 mg by mouth 4 (four) times daily. 03/20/12  Yes Ryan M Dunn, PA-C  cetirizine (ZYRTEC) 10 MG tablet Take 10 mg by mouth daily.   Yes Historical Provider, MD  Docosahexaenoic Acid (DHA COMPLETE PO) Take 1 tablet by mouth daily.   Yes Historical Provider, MD  EPINEPHrine (EPIPEN) 0.3 mg/0.3 mL DEVI Inject 0.3 mLs (0.3 mg total) into the muscle once. 11/06/11  Yes Rickard Patience, PA-C  Glucosamine HCl 1500 MG TABS Take by mouth. 1500mg  glucosamine, 1500 chondroitin, with MSM   Yes Historical Provider, MD  magnesium oxide (MAG-OX) 400 MG tablet Take 400 mg by mouth daily.   Yes Historical Provider, MD  metoprolol succinate (TOPROL-XL) 50 MG 24 hr tablet TAKE 1/2 TABLET DAILY. 12/15/11  Yes Ryan M Dunn, PA-C  metroNIDAZOLE (METROGEL) 1 % gel Apply 1 application topically daily.   Yes Historical Provider, MD  Multiple Vitamin (MULTIVITAMIN) tablet Take 1 tablet by mouth daily.  Yes Historical Provider, MD  polyethylene glycol (MIRALAX / GLYCOLAX) packet Take 17 g by mouth daily.   Yes Historical Provider, MD    Allergies  Allergen Reactions  . Bee Venom     History   Social History  . Marital Status: Single    Spouse Name: N/A    Number of Children: N/A  . Years of Education: N/A   Occupational History  . Not on file.   Social History Main Topics  . Smoking status: Former Games developer  . Smokeless tobacco: Not on file  . Alcohol Use:  Not on file  . Drug Use: Not on file  . Sexually Active: Not on file   Other Topics Concern  . Not on file   Social History Narrative  . No narrative on file    Family History  Problem Relation Age of Onset  . Obesity Mother   . Diabetes Mother   . Hypertension Mother   . Hyperlipidemia Mother        Objective:   Physical Exam  Nursing note and vitals reviewed. Constitutional: She is oriented to person, place, and time. She appears well-developed and well-nourished.  HENT:  Head: Normocephalic and atraumatic.  Right Ear: External ear normal.  Left Ear: External ear normal.  Nose: Nose normal.  Mouth/Throat: Oropharynx is clear and moist. No oropharyngeal exudate.  Eyes: Conjunctivae normal and EOM are normal. Pupils are equal, round, and reactive to light.  Neck: Normal range of motion. Neck supple. No thyromegaly present.  Cardiovascular: Normal rate, regular rhythm and normal heart sounds.   Pulmonary/Chest: Effort normal and breath sounds normal.  Lymphadenopathy:    She has no cervical adenopathy.  Neurological: She is alert and oriented to person, place, and time. No cranial nerve deficit. She exhibits normal muscle tone. Coordination normal.  Skin: Skin is warm and dry. No rash noted.  Psychiatric: She has a normal mood and affect. Her behavior is normal. Judgment and thought content normal.       Assessment & Plan:   1. Elevated LFTs  Hepatic Function Panel, Comprehensive metabolic panel  2. Trigeminal neuralgia       1.  Elevated LFTs: New.  Associated with increased Tegretol intake; has decreased Tegretol to four tablets daily; repeat LFTs.  If LFTs normal, repeat in one month to confirm stability. 2.  Trigeminal Neuralgia: Chronic issue for patient with recent exacerbation/worsening.  Added Dilantin qhs one week ago with stability in symptoms.  Has been maintained on Tegretol for several years.  No previous neurological evaluation; diagnosed by oral surgery  15 years ago.  Continue current regimen.

## 2012-04-03 ENCOUNTER — Encounter: Payer: Self-pay | Admitting: Family Medicine

## 2012-04-04 LAB — CMP AND LIVER
BUN: 12 mg/dL (ref 6–23)
Bilirubin, Direct: <0.1 mg/dL (ref 0.0–0.3)
CO2: 26 mEq/L (ref 19–32)
Creat: 0.52 mg/dL (ref 0.50–1.10)
Glucose, Bld: 85 mg/dL (ref 70–99)
Total Bilirubin: 0.3 mg/dL (ref 0.3–1.2)

## 2012-04-06 NOTE — Progress Notes (Signed)
Reviewed and agree.

## 2012-04-09 NOTE — Addendum Note (Signed)
Addended by: Johnnette Litter on: 04/09/2012 04:22 PM   Modules accepted: Orders

## 2012-04-15 ENCOUNTER — Other Ambulatory Visit: Payer: Self-pay | Admitting: Physician Assistant

## 2012-04-16 ENCOUNTER — Ambulatory Visit (INDEPENDENT_AMBULATORY_CARE_PROVIDER_SITE_OTHER): Payer: BC Managed Care – PPO | Admitting: Family Medicine

## 2012-04-16 VITALS — BP 145/85 | HR 68 | Temp 98.0°F | Resp 18 | Ht 60.0 in | Wt 108.0 lb

## 2012-04-16 DIAGNOSIS — G500 Trigeminal neuralgia: Secondary | ICD-10-CM

## 2012-04-16 DIAGNOSIS — F419 Anxiety disorder, unspecified: Secondary | ICD-10-CM

## 2012-04-16 DIAGNOSIS — R7989 Other specified abnormal findings of blood chemistry: Secondary | ICD-10-CM

## 2012-04-16 DIAGNOSIS — R945 Abnormal results of liver function studies: Secondary | ICD-10-CM

## 2012-04-16 DIAGNOSIS — R1011 Right upper quadrant pain: Secondary | ICD-10-CM

## 2012-04-16 DIAGNOSIS — K5900 Constipation, unspecified: Secondary | ICD-10-CM

## 2012-04-16 DIAGNOSIS — F411 Generalized anxiety disorder: Secondary | ICD-10-CM

## 2012-04-16 LAB — POCT URINALYSIS DIPSTICK
Glucose, UA: NEGATIVE
Ketones, UA: NEGATIVE
Protein, UA: NEGATIVE
Spec Grav, UA: 1.015

## 2012-04-16 LAB — POCT CBC
Hemoglobin: 15.9 g/dL (ref 12.2–16.2)
Lymph, poc: 2 (ref 0.6–3.4)
MCV: 94.8 fL (ref 80–97)
MID (cbc): 0.4 (ref 0–0.9)
POC Granulocyte: 5.7 (ref 2–6.9)
POC MID %: 4.8 %M (ref 0–12)
RBC: 5.17 M/uL (ref 4.04–5.48)
RDW, POC: 12.6 %

## 2012-04-16 LAB — POCT UA - MICROSCOPIC ONLY: Mucus, UA: POSITIVE

## 2012-04-16 MED ORDER — BUPROPION HCL ER (XL) 150 MG PO TB24: 150.0000 mg | ORAL_TABLET | Freq: Every day | ORAL | Status: DC

## 2012-04-16 MED ORDER — CITALOPRAM HYDROBROMIDE 20 MG PO TABS: 20.0000 mg | ORAL_TABLET | Freq: Every day | ORAL | Status: DC

## 2012-04-16 NOTE — Patient Instructions (Addendum)
1. Abdominal pain, acute, right upper quadrant  POCT CBC, POCT urinalysis dipstick, POCT UA - Microscopic Only, Urine culture  2. Anxiety  citalopram (CELEXA) 20 MG tablet, buPROPion (WELLBUTRIN XL) 150 MG 24 hr tablet  3. Constipation    4. Trigeminal neuralgia    5. Liver function test abnormality  Comprehensive metabolic panel     PLEASE START A GENTLE LAXATIVE (PERICOLACE OR SENAKOT-S) DAILY FOR NEXT TWO WEEKS. IF R ABDOMINAL PAIN NOT IMPROVED IN TWO WEEKS, PLEASE CONTACT OFFICE.

## 2012-04-16 NOTE — Progress Notes (Signed)
[address]   [place], [place]  [address]   [phone]  Subjective:    Patient ID: Lacey Burke, female    DOB: 1946-02-15, 66 y.o.   MRN: [id number]  HPIThis 66 y.o. female presents for evaluation of the following acute symptoms:  1. RUQ pain:  Onset five days ago.  Had been pulling weeds the day before onset; thought may be a pulled muscle.  Stomach was also looking bloated and swollen.  Upper abdomen swollen.  No nighttime awakening.  No fever/chills/sweats.  No nausea/vomiting/diarrhea; no worsening constipation; no bloody or black stools.  No dysuria, hematuria but +frequency from baseline.  Nocturia x 1. No heartburn; no indigestion; appetite normal. Eating has no effect. Turning torso certain way causes worsening pain; palpating area worsens pain.  No medications for pain.  Taking Miralax daily; had good bowel movement this morning.  Constipation chronic issue and my be a bit worse for past month; never feels that completes bowel movements for past month.  Colonoscopy a while ago.  2.  Anxiety: Wellbutrin not working really well for patient at this time; sister on Celexa; mother on Celexa; previously took Paxil but caused weight gain.  Considered weaning off of Wellbutrin but family recommended against such.  Nephew also takes Celexa.  Interested in trial of Celexa or similar medication for anxiety.  +excessive worry.  No SI/HI.    3. Elevated LFTs:  Repeat labs two weeks ago and LFTs slightly higher; to follow-up in two more weeks for repeat LFTs. No changes to medications two weeks ago due to minimal increase in levels.  S/p abdominal u/s per Dr. Milus Glazier 8/101/3 +subcentimeter hepatic hemangioma, small renal stone non-obstructing.    4. Trigeminal Neuralgia: actually some improvement since last visit.     Review of Systems  Constitutional: Negative for fever, chills, diaphoresis, appetite change and fatigue.  Gastrointestinal: Positive for abdominal pain, constipation and abdominal  distention. Negative for nausea, vomiting, diarrhea, blood in stool, anal bleeding and rectal pain.  Genitourinary: Positive for frequency. Negative for dysuria, urgency, hematuria, flank pain, vaginal discharge, vaginal pain and pelvic pain.  Musculoskeletal: Positive for myalgias. Negative for back pain, joint swelling, arthralgias and gait problem.  Skin: Negative for rash.  Psychiatric/Behavioral: Negative for suicidal ideas, self-injury and dysphoric mood. The patient is nervous/anxious.        Objective:   Physical Exam  Nursing note and vitals reviewed. Constitutional: She is oriented to person, place, and time. She appears well-developed and well-nourished. No distress.  HENT:  Head: Normocephalic and atraumatic.  Eyes: Conjunctivae normal are normal. Pupils are equal, round, and reactive to light.  Neck: Normal range of motion. Neck supple. No thyromegaly present.  Cardiovascular: Normal rate, regular rhythm, normal heart sounds and intact distal pulses.   No murmur heard. Pulmonary/Chest: Effort normal and breath sounds normal. No respiratory distress. She has no wheezes. She has no rales.  Abdominal: Soft. Bowel sounds are normal. She exhibits no distension and no mass. There is no hepatosplenomegaly. There is tenderness in the right upper quadrant, epigastric area, periumbilical area, suprapubic area, left upper quadrant and left lower quadrant. There is no rigidity, no rebound, no guarding, no CVA tenderness and negative Murphy's sign. No hernia.  Musculoskeletal:       Right shoulder: Normal.       Left shoulder: Normal.       Lumbar back: She exhibits pain. She exhibits normal range of motion, no tenderness and no spasm.  LUMBAR SPINE: FULL ROM LUMBAR SPINE WITH REPRODUCTION OF RUQ PAIN WITH LATERAL SIDE BENDING, ROTATING SIDE TO SIDE.  STRAIGHT LEG RAISES NEGATIVE; TOE AND HEEL WALKING INTACT. R RIBS: NON-TENDER TO PALPATION. CHEST WALL: NON-TENDER TO PALPATION.    Neurological: She is alert and oriented to person, place, and time.  Skin: Skin is warm and dry. No rash noted. She is not diaphoretic.  Psychiatric: She has a normal mood and affect. Her behavior is normal. Judgment and thought content normal.    Results for orders placed in visit on 04/16/12  POCT CBC      Component Value Range   WBC 8.1  4.6 - 10.2 K/uL   Lymph, poc 2.0  0.6 - 3.4   POC LYMPH PERCENT 24.4  10 - 50 %L   MID (cbc) 0.4  0 - 0.9   POC MID % 4.8  0 - 12 %M   POC Granulocyte 5.7  2 - 6.9   Granulocyte percent 70.8  37 - 80 %G   RBC 5.17  4.04 - 5.48 M/uL   Hemoglobin 15.9  12.2 - 16.2 g/dL   HCT, POC 16.1 (*) 09.6 - 47.9 %   MCV 94.8  80 - 97 fL   MCH, POC 30.8  27 - 31.2 pg   MCHC 32.4  31.8 - 35.4 g/dL   RDW, POC 04.5     Platelet Count, POC 436 (*) 142 - 424 K/uL   MPV 7.7  0 - 99.8 fL  POCT URINALYSIS DIPSTICK      Component Value Range   Color, UA yellow     Clarity, UA clear     Glucose, UA neg     Bilirubin, UA neg     Ketones, UA neg     Spec Grav, UA 1.015     Blood, UA neg     pH, UA 6.5     Protein, UA neg     Urobilinogen, UA 0.2     Nitrite, UA neg     Leukocytes, UA Trace    POCT UA - MICROSCOPIC ONLY      Component Value Range   WBC, Ur, HPF, POC 1-3     RBC, urine, microscopic 5-6     Bacteria, U Microscopic 1+     Mucus, UA pos     Epithelial cells, urine per micros 0-1     Crystals, Ur, HPF, POC neg     Casts, Ur, LPF, POC calcium oxalate     Yeast, UA neg    COMPREHENSIVE METABOLIC PANEL      Component Value Range   Sodium 136  135 - 145 mEq/L   Potassium 4.4  3.5 - 5.3 mEq/L   Chloride 99  96 - 112 mEq/L   CO2 28  19 - 32 mEq/L   Glucose, Bld 100 (*) 70 - 99 mg/dL   BUN 9  6 - 23 mg/dL   Creat 4.09  8.11 - 9.14 mg/dL   Total Bilirubin 0.4  0.3 - 1.2 mg/dL   Alkaline Phosphatase 111  39 - 117 U/L   AST 44 (*) 0 - 37 U/L   ALT 60 (*) 0 - 35 U/L   Total Protein 7.4  6.0 - 8.3 g/dL   Albumin 5.0  3.5 - 5.2 g/dL   Calcium  78.2  8.4 - 10.5 mg/dL  URINE CULTURE      Component Value Range   Colony Count 7,000 COLONIES/ML  Organism ID, Bacteria Insignificant Growth         Assessment & Plan:   1. Abdominal pain, acute, right upper quadrant  POCT CBC, POCT urinalysis dipstick, POCT UA - Microscopic Only, Urine culture  2. Anxiety  citalopram (CELEXA) 20 MG tablet, buPROPion (WELLBUTRIN XL) 150 MG 24 hr tablet  3. Constipation    4. Trigeminal neuralgia    5. Liver function test abnormality  Comprehensive metabolic panel    1.  RUQ Abdominal Pain: New.  Associated with bloating.  Onset five days ago.  Worsens with range of motion of torso to suggest musculoskeletal etiology; has also suffered with worsening constipation over past month which could also explain bloating, RUQ pain.  Obtain labs.  Start Pericolace or Senakot-S bid for next two weeks; continue to take Miralax.  Recommend avoid repetitive bending, twisting, rotating.  No concerning GI symptoms at this time; to call if develops n/v/d or melena, bloody stools. Patient expressed understanding.  S/p recent abdominal u/s 02/22/12 normal other than liver hemangioma, small R non-obstructing renal stone; also slightly elevated LFTs not causing symptoms.  2.  Constipation: worsening.  Continue daily Miralax.  Start Pericolace bid for two weeks.  Increase water and fiber intake. 3.  Elevated LFTs: persistent; repeat labs today due to current RUQ pain.  S/p recent abdominal u/s. 4.  Trigeminal neuralgia: stable with improvement in past two weeks; no change in management at this time. 5.  Anxiety: uncontrolled with Wellbutrin.  Recommend weaning Wellbutrin over next several weeks; rx for Wellbutrin XL 150mg  one daily x 2 weeks and then one every other day for one week and then stop.  Rx for Celexa 20mg  one daily to start now.

## 2012-04-17 LAB — COMPREHENSIVE METABOLIC PANEL
BUN: 9 mg/dL (ref 6–23)
CO2: 28 mEq/L (ref 19–32)
Creat: 0.65 mg/dL (ref 0.50–1.10)
Glucose, Bld: 100 mg/dL — ABNORMAL HIGH (ref 70–99)
Total Bilirubin: 0.4 mg/dL (ref 0.3–1.2)

## 2012-04-18 LAB — URINE CULTURE: Colony Count: 7000

## 2012-04-20 ENCOUNTER — Telehealth: Payer: Self-pay

## 2012-04-20 NOTE — Telephone Encounter (Signed)
Lacey Burke calling for her lab results.  [phone]

## 2012-04-21 ENCOUNTER — Other Ambulatory Visit: Payer: Self-pay | Admitting: Family Medicine

## 2012-04-21 DIAGNOSIS — R7989 Other specified abnormal findings of blood chemistry: Secondary | ICD-10-CM

## 2012-04-21 DIAGNOSIS — R945 Abnormal results of liver function studies: Secondary | ICD-10-CM

## 2012-04-21 NOTE — Telephone Encounter (Signed)
Liver function tests still elevated but only slightly. Due to persistent trigeminal neuralgia symptoms, I would not change any medications at this time and just repeat liver function tests again in one month. If liver function still elevated at that time, would then consider adjusting medication doses.   Letter mailed to her, but apparently she has not gotten it yet. I left message for her.

## 2012-04-21 NOTE — Telephone Encounter (Signed)
Pt called back and I gave her results/instr's. Pt expressed a lot of concern about her continuing swelling and tenderness in her abdomin and asked to speak w/Dr Katrinka Blazing or Dr L. Dr L is in office and agreed to speak w/pt.

## 2012-04-22 NOTE — Telephone Encounter (Signed)
I spoke with the patient yesterday, 04/21/2012, who has developed increasing bloating diffusely with loss of appetite.  No nausea or vomiting, no fever.  She had a negative U/S, but the symptoms have worsened dramatically over the past 5 days.

## 2012-04-23 ENCOUNTER — Other Ambulatory Visit: Payer: BC Managed Care – PPO

## 2012-04-23 ENCOUNTER — Telehealth: Payer: Self-pay | Admitting: Radiology

## 2012-04-23 ENCOUNTER — Ambulatory Visit
Admission: RE | Admit: 2012-04-23 | Discharge: 2012-04-23 | Disposition: A | Discharge status: Home / Self Care | Payer: BC Managed Care – PPO | Source: Ambulatory Visit | Attending: Family Medicine | Admitting: Family Medicine

## 2012-04-23 DIAGNOSIS — R109 Unspecified abdominal pain: Secondary | ICD-10-CM

## 2012-04-23 MED ORDER — IOHEXOL 300 MG/ML  SOLN
100.0000 mL | Freq: Once | INTRAMUSCULAR | Status: AC | PRN
  Administered 2012-04-23: 100 mL via INTRAVENOUS

## 2012-04-23 NOTE — Telephone Encounter (Signed)
[facility] called,scan should be CT Abd/ Pelvis with contrast is ordered with and without, have corrected orders.

## 2012-04-25 ENCOUNTER — Telehealth: Payer: Self-pay | Admitting: Family Medicine

## 2012-04-25 NOTE — Telephone Encounter (Signed)
Pt's sister called stated pt is very anxious about lab results. Please call pt asap with results. [phone]

## 2012-04-25 NOTE — Telephone Encounter (Signed)
CT results show constipation which is probably the cause of her bloating , appendix not seen, 1 mm  kidney stone on the Right.  I would recommend Miralax bid until feels like she is having a full BM and then return qd for 2 wks.  If symptoms continue or worsen RTC.

## 2012-04-25 NOTE — Telephone Encounter (Signed)
Patient called requesting results of CT abd/pelvis. Please advise.

## 2012-04-25 NOTE — Telephone Encounter (Signed)
patient notified and voiced understanding. She wants to know what about the kidney stone? Is this what is causing her pain R side? Please advise.

## 2012-04-26 ENCOUNTER — Telehealth: Payer: Self-pay

## 2012-04-26 NOTE — Telephone Encounter (Signed)
Left message for patient to return call.

## 2012-04-26 NOTE — Telephone Encounter (Signed)
PT IS RETURNING OUR CALL FOR LAB RESULTS ° ° ° ° °

## 2012-04-26 NOTE — Telephone Encounter (Signed)
This is not what is causing her pain, it is an incidental finding.  I think her constipation is the most likely but it might be a good idea to RTC to figure out best next plan.

## 2012-04-27 NOTE — Telephone Encounter (Signed)
patient notified and voiced understanding. 

## 2012-04-27 NOTE — Telephone Encounter (Signed)
patient returned call. notified and voiced understanding.

## 2012-05-11 ENCOUNTER — Other Ambulatory Visit: Payer: Self-pay | Admitting: Physician Assistant

## 2012-05-13 ENCOUNTER — Telehealth: Payer: Self-pay | Admitting: *Deleted

## 2012-05-13 ENCOUNTER — Other Ambulatory Visit: Payer: Self-pay | Admitting: Radiology

## 2012-05-13 DIAGNOSIS — R920 Mammographic microcalcification found on diagnostic imaging of breast: Secondary | ICD-10-CM | POA: Diagnosis not present

## 2012-05-13 MED ORDER — CARBAMAZEPINE 200 MG PO TABS: 200.0000 mg | ORAL_TABLET | Freq: Four times a day (QID) | ORAL | Status: DC

## 2012-05-13 NOTE — Telephone Encounter (Signed)
Tegretol has been sent at the dose discussed at her last visit - 200mg  4 times daily.  Per her last lab note on 04/16/12, pt to have repeat LFTs in one month, so patient is due this coming week for labs

## 2012-05-13 NOTE — Telephone Encounter (Signed)
Pt came into office and stated that she is taking her Tegretol 4 per day and twice a day and she needs a new rx.  See note from 09/19.  Is it ok to send in right rx.

## 2012-05-13 NOTE — Telephone Encounter (Signed)
patient notified and voiced understanding. 

## 2012-05-20 ENCOUNTER — Ambulatory Visit (INDEPENDENT_AMBULATORY_CARE_PROVIDER_SITE_OTHER): Payer: BC Managed Care – PPO | Admitting: Family Medicine

## 2012-05-20 ENCOUNTER — Encounter: Payer: Self-pay | Admitting: Family Medicine

## 2012-05-20 VITALS — BP 131/76 | HR 61 | Temp 97.5°F | Resp 16 | Ht 61.0 in | Wt 109.8 lb

## 2012-05-20 DIAGNOSIS — G500 Trigeminal neuralgia: Secondary | ICD-10-CM

## 2012-05-20 DIAGNOSIS — R945 Abnormal results of liver function studies: Secondary | ICD-10-CM

## 2012-05-20 DIAGNOSIS — R7989 Other specified abnormal findings of blood chemistry: Secondary | ICD-10-CM

## 2012-05-20 NOTE — Progress Notes (Signed)
66 year old woman with right trigeminal neuralgia comes in because of persistent elevated liver function tests. She's had these recheck. She recently had a breast biopsy which was negative.  She thinks that the pain is largely controlled although last week she did reduce the Tegretol and she wasn't sure whether maybe it was coming back. Sometimes just the thought of it makes it hurt.  Objective: HEENT normal, no acute distress Abdomen: Soft nontender without HSM  Assessment: Trigeminal neuralgia seems to be under control as best I can tell. Liver function tests in the past been some minimally elevated but I do not think this poses significant risk to the patient.  Plan: Check liver function tests. If there is any significant elevation, consider reducing the Tegretol by 1 tablet per day and following up in 6 weeks.

## 2012-05-21 LAB — COMPREHENSIVE METABOLIC PANEL
ALT: 23 U/L (ref 0–35)
AST: 22 U/L (ref 0–37)
Albumin: 4.3 g/dL (ref 3.5–5.2)
Alkaline Phosphatase: 97 U/L (ref 39–117)
BUN: 10 mg/dL (ref 6–23)
CO2: 29 mEq/L (ref 19–32)
Calcium: 9.4 mg/dL (ref 8.4–10.5)
Chloride: 99 mEq/L (ref 96–112)
Creat: 0.57 mg/dL (ref 0.50–1.10)
Glucose, Bld: 85 mg/dL (ref 70–99)
Potassium: 4.2 mEq/L (ref 3.5–5.3)
Sodium: 135 mEq/L (ref 135–145)
Total Bilirubin: 0.4 mg/dL (ref 0.3–1.2)
Total Protein: 6.5 g/dL (ref 6.0–8.3)

## 2012-05-22 ENCOUNTER — Telehealth: Payer: Self-pay | Admitting: Family Medicine

## 2012-05-22 NOTE — Telephone Encounter (Signed)
patient returned call regarding labs. Patient notified and voiced understanding labs normal.

## 2012-06-07 ENCOUNTER — Other Ambulatory Visit: Payer: Self-pay | Admitting: Physician Assistant

## 2012-06-18 NOTE — Progress Notes (Signed)
Reviewed and agree.

## 2012-07-17 ENCOUNTER — Other Ambulatory Visit: Payer: Self-pay | Admitting: Family Medicine

## 2012-07-20 ENCOUNTER — Other Ambulatory Visit: Payer: Self-pay | Admitting: Physician Assistant

## 2012-07-20 NOTE — Telephone Encounter (Signed)
Was due for f/u in mid Dec 2013, needs labs

## 2012-08-04 ENCOUNTER — Other Ambulatory Visit: Payer: Self-pay | Admitting: Physician Assistant

## 2012-08-23 ENCOUNTER — Other Ambulatory Visit: Payer: Self-pay | Admitting: Family Medicine

## 2012-09-01 ENCOUNTER — Encounter: Payer: Self-pay | Admitting: Family Medicine

## 2012-09-01 ENCOUNTER — Ambulatory Visit (INDEPENDENT_AMBULATORY_CARE_PROVIDER_SITE_OTHER): Payer: BC Managed Care – PPO | Admitting: Family Medicine

## 2012-09-01 VITALS — BP 133/76 | HR 63 | Temp 98.5°F | Resp 16 | Ht 60.5 in | Wt 110.0 lb

## 2012-09-01 DIAGNOSIS — Z1211 Encounter for screening for malignant neoplasm of colon: Secondary | ICD-10-CM

## 2012-09-01 DIAGNOSIS — G500 Trigeminal neuralgia: Secondary | ICD-10-CM

## 2012-09-01 DIAGNOSIS — Z0000 Encounter for general adult medical examination without abnormal findings: Secondary | ICD-10-CM

## 2012-09-01 LAB — CBC WITH DIFFERENTIAL/PLATELET
Basophils Absolute: 0 10*3/uL (ref 0.0–0.1)
Basophils Relative: 1 % (ref 0–1)
Hemoglobin: 14.7 g/dL (ref 12.0–15.0)
MCHC: 35.4 g/dL (ref 30.0–36.0)
Neutro Abs: 4.1 10*3/uL (ref 1.7–7.7)
Neutrophils Relative %: 72 % (ref 43–77)
Platelets: 325 10*3/uL (ref 150–400)
RDW: 12.9 % (ref 11.5–15.5)

## 2012-09-01 LAB — COMPREHENSIVE METABOLIC PANEL
AST: 29 U/L (ref 0–37)
Albumin: 4.4 g/dL (ref 3.5–5.2)
Alkaline Phosphatase: 111 U/L (ref 39–117)
Potassium: 4.4 mEq/L (ref 3.5–5.3)
Sodium: 137 mEq/L (ref 135–145)
Total Protein: 6.3 g/dL (ref 6.0–8.3)

## 2012-09-01 LAB — POCT URINALYSIS DIPSTICK
Blood, UA: NEGATIVE
Glucose, UA: NEGATIVE
Ketones, UA: NEGATIVE
Protein, UA: NEGATIVE
Spec Grav, UA: 1.015
Urobilinogen, UA: 0.2

## 2012-09-01 LAB — LIPID PANEL
HDL: 116 mg/dL (ref >39)
Total CHOL/HDL Ratio: 2.1 Ratio
VLDL: 10 mg/dL (ref 0–40)

## 2012-09-01 LAB — VITAMIN B12: Vitamin B-12: 561 pg/mL (ref 211–911)

## 2012-09-01 LAB — TSH: TSH: 1.455 u[IU]/mL (ref 0.350–4.500)

## 2012-09-01 NOTE — Progress Notes (Signed)
  Subjective:    Patient ID: Lacey Burke, female    DOB: 26-Oct-1945, 66 y.o.   MRN: [id number]  HPI    Review of Systems  Respiratory: Positive for cough.        Objective:   Physical Exam        Assessment & Plan:

## 2012-09-01 NOTE — Progress Notes (Signed)
[address]   [place], [place]  [address]   [id number]  Subjective:    Patient ID: Lacey Burke, female    DOB: [birth year], 67 y.o.   MRN: [id number]  HPI This 67 y.o. female presents for CPE.  Last physical several years ago. Pap smear 10/2011.  [facility] Fordyce. Normal. Mammogram 05/2012.  S/p needle biopsy microcalcifications negative.  Yolanda Bonine.   Colonoscopy several years ago; 07/2002.  Not sure who completed.   Bone density scan declined; does not plan to take medication.   TDAP less than 7-10 years. Pneumovax never.  Would like to research. Zostavax never.   Influenza vaccine 05/2012. Eye exam yearly; 09/2011; early cataracts; no glaucoma.  Contacts and glasses. Dental exam refuses due to trigeminal neuralgia; brushes and flosses daily.    Trigimenal Neuraglia:  1/26 jolt in face; 2/4 repeat jolt; increased to four Tegretol.  2/7 jolt while eating; 2/9 recurrent; 2/10 repeat Tegretol increased to 5.  Ran out of Dilantin one week prior.  No recurrent jolts since increasing to 5 Tegretol.  Takes Tegretol 1 every am with Celexa; 1 at 2:30; 1 at 6:30; 2 at bedtime.     Review of Systems  Constitutional: Negative for fever, chills, diaphoresis, activity change, appetite change, fatigue and unexpected weight change.  HENT: Negative for hearing loss, ear pain, nosebleeds, congestion, sore throat, facial swelling, rhinorrhea, sneezing, drooling, mouth sores, trouble swallowing, neck pain, neck stiffness, dental problem, voice change, postnasal drip, sinus pressure, tinnitus and ear discharge.   Eyes: Negative for photophobia, pain, discharge, redness, itching and visual disturbance.  Respiratory: Positive for cough. Negative for apnea, choking, chest tightness, shortness of breath, wheezing and stridor.   Cardiovascular: Negative for chest pain, palpitations and leg swelling.  Gastrointestinal: Negative for nausea, vomiting, abdominal pain, diarrhea, constipation, blood in stool,  abdominal distention, anal bleeding and rectal pain.  Genitourinary: Negative for dysuria, urgency, frequency, hematuria, flank pain, decreased urine volume, vaginal bleeding, vaginal discharge, enuresis, difficulty urinating, genital sores, vaginal pain, menstrual problem, pelvic pain and dyspareunia.  Musculoskeletal: Negative for myalgias, back pain, joint swelling and gait problem.  Skin: Negative for color change, pallor, rash and wound.  Neurological: Negative for dizziness, tremors, seizures, syncope, facial asymmetry, speech difficulty, weakness, light-headedness, numbness and headaches.  Hematological: Negative for adenopathy. Does not bruise/bleed easily.  Psychiatric/Behavioral: Positive for dysphoric mood. Negative for hallucinations, behavioral problems, confusion, sleep disturbance, self-injury, decreased concentration and agitation. The patient is not nervous/anxious and is not hyperactive.         Past Medical History  Diagnosis Date  . Trigeminal neuralgia 07/15/1996  . Osteoporosis   . Allergy     Zyrtec daily.  . Anxiety   . Blood transfusion without reported diagnosis 07/15/1980    SAB at second trimester with IUD; required transfusion.  Marland Kitchen Heart murmur     childhood onset.  Marland Kitchen Hypertension 07/15/1998  . Cataract     Past Surgical History  Procedure Laterality Date  . Tonsilectomy, adenoidectomy, bilateral myringotomy and tubes    . Cosmetic surgery    . Breast surgery      Augmentation in 1970's/Implant removal 1990's  . Nose surgery  07/15/1978  . Abdominal hysterectomy  2007    Fibroids; ovaries resected.    Prior to Admission medications   Medication Sig Start Date End Date Taking? Authorizing Provider  Calcium-Vitamin D-Vitamin K 750-500-40 MG-UNT-MCG TABS Take by mouth daily.   Yes Historical Provider, MD  carbamazepine (TEGRETOL) 200 MG tablet take  1 tablet by mouth four times a day 08/04/12  Yes Anders Simmonds, PA-C  cetirizine (ZYRTEC) 10 MG tablet Take 10  mg by mouth daily.   Yes Historical Provider, MD  citalopram (CELEXA) 20 MG tablet take 1 tablet by mouth once daily 08/23/12  Yes Eleanore E Egan, PA-C  Glucosamine HCl 1500 MG TABS Take by mouth. 1500mg  glucosamine, 1500 chondroitin, with MSM   Yes Historical Provider, MD  magnesium oxide (MAG-OX) 400 MG tablet Take 400 mg by mouth daily.   Yes Historical Provider, MD  metoprolol succinate (TOPROL-XL) 50 MG 24 hr tablet TAKE 1/2 TABLET DAILY. 12/15/11  Yes Ryan M Dunn, PA-C  Multiple Vitamin (MULTIVITAMIN) tablet Take 1 tablet by mouth daily.   Yes Historical Provider, MD  polyethylene glycol (MIRALAX / GLYCOLAX) packet Take 17 g by mouth daily.   Yes Historical Provider, MD  EPINEPHrine (EPIPEN) 0.3 mg/0.3 mL DEVI Inject 0.3 mLs (0.3 mg total) into the muscle once. 11/06/11   Rickard Patience, PA-C  metroNIDAZOLE (METROGEL) 1 % gel Apply 1 application topically daily.    Historical Provider, MD  phenytoin (DILANTIN) 100 MG ER capsule Take 1 capsule (100 mg total) by mouth at bedtime. NEED LABS 07/20/12   Godfrey Pick, PA-C  phenytoin (DILANTIN) 50 MG tablet Chew 50 mg by mouth at bedtime.     Historical Provider, MD    Allergies  Allergen Reactions  . Bee Venom     History   Social History  . Marital Status: Single    Spouse Name: N/A    Number of Children: N/A  . Years of Education: N/A   Occupational History  . Not on file.   Social History Main Topics  . Smoking status: Former Games developer  . Smokeless tobacco: Not on file  . Alcohol Use: No  . Drug Use: No  . Sexually Active: No   Other Topics Concern  . Not on file   Social History Narrative   Marital: divorced since 1994 after 20 years of marriage; not dating and not interested.       Children: 2 children/sons (37, 29); no grandchildren.      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  Plans to retire in 11/2012.      Tobacco: quit in college.       Alcohol:  Socially wine.      Drugs: none      Exercise:  Three days per week; jogging and walking. Computer programs stengthening.    Family History  Problem Relation Age of Onset  . Obesity Mother   . Diabetes Mother   . Hypertension Mother   . Hyperlipidemia Mother   . Diabetes Father   . Arthritis Father     Spinal stenosis  . Irritable bowel syndrome Son   . GER disease Son   . Mental illness Sister     anxiety  . Mental illness Sister     anxiety  . Arthritis Sister 50    spinal stenosis    Objective:   Physical Exam  Nursing note and vitals reviewed. Constitutional: She is oriented to person, place, and time. She appears well-developed and well-nourished. No distress.  HENT:  Head: Normocephalic and atraumatic.  Right Ear: External ear normal.  Left Ear: External ear normal.  Nose: Nose normal.  Mouth/Throat: Oropharynx is clear and moist.  Eyes: Conjunctivae and EOM are normal.  Pupils are equal, round, and reactive to light.  Neck: Normal range of motion. Neck supple. No thyromegaly present.  Cardiovascular: Normal rate, regular rhythm and normal heart sounds.  Exam reveals no gallop and no friction rub.   No murmur heard. Pulmonary/Chest: Effort normal and breath sounds normal. No respiratory distress. She has no wheezes. She has no rales.  Abdominal: Soft. Bowel sounds are normal. She exhibits no distension and no mass. There is no tenderness. There is no rebound and no guarding.  Musculoskeletal:       Right shoulder: Normal.       Left shoulder: Normal.       Cervical back: Normal.       Thoracic back: Normal.       Lumbar back: Normal.  Lymphadenopathy:    She has no cervical adenopathy.  Neurological: She is alert and oriented to person, place, and time. She has normal reflexes. No cranial nerve deficit. She exhibits normal muscle tone. Coordination normal.  Skin: Skin is warm and dry. No rash noted. She is not diaphoretic. No erythema. No pallor.    Psychiatric: She has a normal mood and affect. Her behavior is normal. Judgment and thought content normal.    EKG:  NSR.      Assessment & Plan:  Annual physical exam - Plan: CBC with Differential, Comprehensive metabolic panel, Hemoglobin A1c, Lipid panel, TSH, Vitamin B12, Vitamin D 25 hydroxy, POCT urinalysis dipstick, EKG 12-Lead, Folate

## 2012-09-01 NOTE — Patient Instructions (Addendum)
Annual physical exam - Plan: CBC with Differential, Comprehensive metabolic panel, Hemoglobin A1c, Lipid panel, TSH, Vitamin B12, Vitamin D 25 hydroxy, POCT urinalysis dipstick, EKG 12-Lead, Folate     START ASPIRIN 81 MG ONE TABLET DAILY FOR HEART ATTACK AND STROKE PREVENTION.

## 2012-09-08 ENCOUNTER — Encounter: Payer: Self-pay | Admitting: Family Medicine

## 2012-09-08 DIAGNOSIS — Z0000 Encounter for general adult medical examination without abnormal findings: Secondary | ICD-10-CM | POA: Insufficient documentation

## 2012-09-08 NOTE — Assessment & Plan Note (Signed)
Worsening since last visit; increased Tegretol to five tablets daily and stopped Dilantin; obtain labs.

## 2012-09-08 NOTE — Assessment & Plan Note (Signed)
Anticipatory guidance --- ASA 81mg  daily.  Gyn care per gynecology and UTD.  Refer for colonoscopy.  Immunizations----provided with information on Pneumovax and Zostavax.  Refused bone density scan.  Obtain labs.

## 2012-09-10 ENCOUNTER — Other Ambulatory Visit: Payer: Self-pay | Admitting: Physician Assistant

## 2012-09-11 ENCOUNTER — Telehealth: Payer: Self-pay | Admitting: *Deleted

## 2012-09-11 NOTE — Telephone Encounter (Signed)
On 2/18 Dilantin was D/C'd and Tegretol increased from 4 daily to 5 daily.  Dr. Katrinka Blazing, Please advise.

## 2012-09-11 NOTE — Telephone Encounter (Signed)
Pt states that she is having more jolts and needs a new rx of tegretol called in for 6 a day.

## 2012-09-13 MED ORDER — CARBAMAZEPINE 200 MG PO TABS: 400.0000 mg | ORAL_TABLET | Freq: Three times a day (TID) | ORAL | Status: DC

## 2012-09-13 NOTE — Telephone Encounter (Signed)
New rx sent to pharmacy for Tegretol 200mg  six tablets daily.  Pt will need follow-up with me in 2-4 weeks for repeat labs and follow-up on trigeminal neuralgia.  I will have scheduling contact her to make appointment.  Please advise pt that 6 tablets is the maximum amount of Tegretol allowed per day; she cannot increase further.

## 2012-09-13 NOTE — Telephone Encounter (Signed)
Scheduling --- please schedule follow-up visit with me in 3-4 weeks.

## 2012-09-14 NOTE — Telephone Encounter (Signed)
Recommend pt continue six tablets daily for next 3-5 days; side effects may improve as she adjust to new dose; however, very important to follow--up so that Tegretol level can be obtained on six tablets daily. If side effects worsen instead of improve over next week, she will need to contact office and decrease back down to five tablets daily.

## 2012-09-14 NOTE — Telephone Encounter (Signed)
LMOM to CB. 

## 2012-09-14 NOTE — Telephone Encounter (Signed)
Pt CB and I discussed new Rx and follow-up instr's. Pt agreed and will set up appt when Scheduling calls her. She reports that she increased the dose to #6 tabs QD over the weekend when she was having increasing episodes and does report that she is having some slight vision side effects (blurriness) and feels "not quite dizzy, but just a little off". Pt stated that she thinks she may have had similar effects when she increased dose in the past, but wanted to report this to Dr Katrinka Blazing, esp w/the knowledge that she is at max dose now. Dr Katrinka Blazing, pt wants to ask if she should just give it a couple of days for her to adjust to new dose, or if Dr Katrinka Blazing advises her to decrease back down? Pt is hesitant to decrease since she was having increasing episodes.

## 2012-09-14 NOTE — Telephone Encounter (Signed)
Tegretol was sent in for her, called her to advise of this. Left message for her to call me back so I can advise she is nearing max dosage and she needs an appt.

## 2012-09-15 ENCOUNTER — Encounter: Payer: Self-pay | Admitting: Family Medicine

## 2012-09-15 NOTE — Telephone Encounter (Signed)
Appt made for 4/9 with Dr. Katrinka Blazing.

## 2012-09-15 NOTE — Telephone Encounter (Signed)
Pt CB and I explained Dr Michaelle Copas message and instr's. Pt agreed.

## 2012-10-21 ENCOUNTER — Encounter: Payer: Self-pay | Admitting: Family Medicine

## 2012-10-21 ENCOUNTER — Ambulatory Visit (INDEPENDENT_AMBULATORY_CARE_PROVIDER_SITE_OTHER): Payer: BC Managed Care – PPO | Admitting: Family Medicine

## 2012-10-21 VITALS — BP 128/69 | HR 63 | Temp 97.8°F | Resp 18 | Wt 111.0 lb

## 2012-10-21 DIAGNOSIS — E871 Hypo-osmolality and hyponatremia: Secondary | ICD-10-CM

## 2012-10-21 DIAGNOSIS — G500 Trigeminal neuralgia: Secondary | ICD-10-CM

## 2012-10-21 DIAGNOSIS — R748 Abnormal levels of other serum enzymes: Secondary | ICD-10-CM | POA: Diagnosis not present

## 2012-10-21 DIAGNOSIS — R7989 Other specified abnormal findings of blood chemistry: Secondary | ICD-10-CM | POA: Diagnosis not present

## 2012-10-21 DIAGNOSIS — R945 Abnormal results of liver function studies: Secondary | ICD-10-CM

## 2012-10-21 LAB — CBC
HCT: 40.1 % (ref 36.0–46.0)
Hemoglobin: 14.3 g/dL (ref 12.0–15.0)
MCV: 87 fL (ref 78.0–100.0)
RDW: 13.2 % (ref 11.5–15.5)
WBC: 6.8 10*3/uL (ref 4.0–10.5)

## 2012-10-21 LAB — COMPREHENSIVE METABOLIC PANEL
Albumin: 4.5 g/dL (ref 3.5–5.2)
Alkaline Phosphatase: 112 U/L (ref 39–117)
BUN: 16 mg/dL (ref 6–23)
CO2: 27 mEq/L (ref 19–32)
Calcium: 9.8 mg/dL (ref 8.4–10.5)
Glucose, Bld: 91 mg/dL (ref 70–99)
Potassium: 4.5 mEq/L (ref 3.5–5.3)
Sodium: 133 mEq/L — ABNORMAL LOW (ref 135–145)
Total Protein: 6.7 g/dL (ref 6.0–8.3)

## 2012-10-21 NOTE — Progress Notes (Signed)
   [address]   [place], [place]  [address]   [phone]  Subjective:    Patient ID: Lacey Burke, female    DOB: [birth year], 67 y.o.   MRN: [id number]  HPI This 67 y.o. female presents for evaluation of the following:    1.  Trigeminal Neuralgia: increased to six Tegretol tablets daily after last visit for worsening neuralgia pain.  Decreased to five tablets daily two weeks ago; has been taking five daily for past 2-3 weeks.  Naturalist provider recommended [employer], Cumarin.    Naturalist:  Recommended avoiding corn, brown rice due to sensitivities.  Recommend Neral Fix and Curamin.  Also recommended CoQ10, Digestive Enzymes, Slippery Elm.  Spring Cleanse program:  Liver cleanse formula; [facility].  Recommended against Gingko due to potential interaction.    2.  Colonoscopy: scheduled for 01/02/13 with Dr. Loreta Ave.  3.  Hyponatremia:  Sodium low at visit with Dr. Loreta Ave; due for repeat labs.  No nausea, headache, confusion.  Feeling well at this time.  4. Zostavax: requesting; does not want further neuralgia pain or nerve inflammation in future.    Review of Systems  Constitutional: Negative for chills, diaphoresis and fatigue.  Gastrointestinal: Positive for constipation. Negative for nausea, vomiting, abdominal pain and diarrhea.  Skin: Negative for rash.  Neurological: Negative for dizziness, facial asymmetry, light-headedness and headaches.       Objective:   Physical Exam  Nursing note and vitals reviewed. Constitutional: She is oriented to person, place, and time. She appears well-developed and well-nourished. No distress.  Eyes: Conjunctivae are normal. Pupils are equal, round, and reactive to light.  Neck: Normal range of motion. Neck supple. No thyromegaly present.  Cardiovascular: Normal rate, regular rhythm and normal heart sounds.   Pulmonary/Chest: Effort normal and breath sounds normal.  Lymphadenopathy:    She has no cervical adenopathy.  Neurological: She is  alert and oriented to person, place, and time. No cranial nerve deficit. She exhibits normal muscle tone. Coordination normal.  Skin: She is not diaphoretic.  Psychiatric: She has a normal mood and affect. Her behavior is normal.       Assessment & Plan:  Abnormal liver enzymes - Plan: CBC, Carbamazepine level, total  Abnormal liver function tests  Trigeminal neuralgia  Hyponatremia  Elevated liver function tests - Plan: Comprehensive metabolic panel   1. Trigeminal Neuralgia:  Worsening after last visit with increase in Tegretol to six tablets daily; has now returned to five tablets daily.  Obtain labs. 2.  Elevated LFTs: improved; repeat today. 3  Hyponatremia:  New.  Present on labs at Dr. Renie Ora office; repeat today; asymptomatic; etiology unclear. 4.  Immunization Counseling:  Agreeable and supportive of shingles vaccine.  No orders of the defined types were placed in this encounter.

## 2012-10-21 NOTE — Patient Instructions (Addendum)
Abnormal liver enzymes - Plan: CBC, Carbamazepine level, total  Abnormal liver function tests  Trigeminal neuralgia  Hyponatremia

## 2012-11-02 ENCOUNTER — Encounter: Payer: Self-pay | Admitting: Family Medicine

## 2012-11-04 ENCOUNTER — Encounter: Payer: Self-pay | Admitting: *Deleted

## 2012-11-05 ENCOUNTER — Telehealth: Payer: Self-pay

## 2012-11-05 NOTE — Telephone Encounter (Signed)
Left message for pt to check mychart- Dr Katrinka Blazing had sent her an email with lab results. Advised if she had further questions to give [place] a call back.

## 2012-11-05 NOTE — Telephone Encounter (Signed)
Pt is calling for lab results from last ov

## 2012-11-06 ENCOUNTER — Telehealth: Payer: Self-pay

## 2012-11-06 NOTE — Telephone Encounter (Signed)
Left message for return call.

## 2012-11-06 NOTE — Telephone Encounter (Signed)
Please call and clarify message --- is patient's trigeminal neuralgia pain worsening again?  Or is it another pain that is worsening?

## 2012-11-06 NOTE — Telephone Encounter (Signed)
Pt is calling because she was calling because she read a message from Dr. Katrinka Blazing on MyChart that if she was still in pain to call back and she what she could do. 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

## 2012-11-06 NOTE — Telephone Encounter (Signed)
Pt called back and her trigeminal pain is getting worse. The spasms are becoming more frequent. She has been taking the Cumarin. Is there something else she can take? The Nural Fix has been ordered but is coming from a mail order company.

## 2012-11-08 ENCOUNTER — Ambulatory Visit (INDEPENDENT_AMBULATORY_CARE_PROVIDER_SITE_OTHER): Payer: BC Managed Care – PPO | Admitting: Family Medicine

## 2012-11-08 VITALS — BP 151/85 | HR 62 | Temp 98.0°F | Resp 16 | Ht 60.0 in | Wt 112.0 lb

## 2012-11-08 DIAGNOSIS — G500 Trigeminal neuralgia: Secondary | ICD-10-CM

## 2012-11-08 MED ORDER — CARBAMAZEPINE 200 MG PO TABS: 400.0000 mg | ORAL_TABLET | Freq: Three times a day (TID) | ORAL | Status: DC

## 2012-11-08 NOTE — Progress Notes (Signed)
S:  67 yo woman with trigeminal neuralgia who decreased her tegretol to 1000 mg daily about 3 weeks ago.  She had her CPE with Dr. Katrinka Blazing and was advised, because of the tegretol level of 12.8, that she should not increase the level.  Nevertheless, the recommended maximum dose is 1200 mg daily for trigeminal neuralgia and patient is now experiencing breakthrough pain for the last week. She failed the radiofrequency ablation with Dr. Channing Mutters  Objective:  Results for orders placed in visit on 10/21/12  CBC      Result Value Range   WBC 6.8  4.0 - 10.5 K/uL   RBC 4.61  3.87 - 5.11 MIL/uL   Hemoglobin 14.3  12.0 - 15.0 g/dL   HCT 16.1  09.6 - 04.5 %   MCV 87.0  78.0 - 100.0 fL   MCH 31.0  26.0 - 34.0 pg   MCHC 35.7  30.0 - 36.0 g/dL   RDW 40.9  81.1 - 91.4 %   Platelets 337  150 - 400 K/uL  CARBAMAZEPINE LEVEL, TOTAL      Result Value Range   Carbamazepine Lvl 12.8 (*) 4.0 - 12.0 ug/mL  COMPREHENSIVE METABOLIC PANEL      Result Value Range   Sodium 133 (*) 135 - 145 mEq/L   Potassium 4.5  3.5 - 5.3 mEq/L   Chloride 100  96 - 112 mEq/L   CO2 27  19 - 32 mEq/L   Glucose, Bld 91  70 - 99 mg/dL   BUN 16  6 - 23 mg/dL   Creat 7.82  9.56 - 2.13 mg/dL   Total Bilirubin 0.3  0.3 - 1.2 mg/dL   Alkaline Phosphatase 112  39 - 117 U/L   AST 21  0 - 37 U/L   ALT 24  0 - 35 U/L   Total Protein 6.7  6.0 - 8.3 g/dL   Albumin 4.5  3.5 - 5.2 g/dL   Calcium 9.8  8.4 - 08.6 mg/dL   Normal facial appearance and facial motor  Assessment:  Trigeminal neuralgia. This problem can be so horrific the patient becomes totally incapacitated. At this point she says having a few intermittent jabs of pain during the day, but she has been to the point of needing general anesthesia in the past. I reviewed the risks and benefits of the Tegretol the patient and consulted the Epocrates website for dosage range.  I feel it is safe to increase the dose where she was taking medicine for says she's had no history of adverse  effects with a higher dose.  Plan:  Increase tegretol to 200 mg 2 tablets tid Referral to neurologist for second opinion when patient gives me the green light:  Dr. Colbert Coyer in [place]. She will try the increased dose of Tegretol over the next 48 hours.  Elvina Sidle, MD

## 2012-11-09 NOTE — Telephone Encounter (Signed)
Patient evaluated in office 11/08/12 by Dr. Milus Glazier.  No need to call back.

## 2012-11-11 ENCOUNTER — Other Ambulatory Visit: Payer: Self-pay | Admitting: Family Medicine

## 2012-11-11 ENCOUNTER — Telehealth: Payer: Self-pay

## 2012-11-11 DIAGNOSIS — G500 Trigeminal neuralgia: Secondary | ICD-10-CM

## 2012-11-11 DIAGNOSIS — S0432XS Injury of trigeminal nerve, left side, sequela: Secondary | ICD-10-CM

## 2012-11-11 NOTE — Telephone Encounter (Signed)
DR.LAUENSTEIN, PT TRIGEMINAL NERVE IS GETTING WORSE, PT WOULD LIKE TO KNOW IF SHE NEEDS TO BE SEEN AGAIN. BEST# [phone]

## 2012-11-11 NOTE — Telephone Encounter (Signed)
Called her, she does want to proceed with the referral this is put in for her. To you FYI

## 2012-11-11 NOTE — Telephone Encounter (Signed)
Plan: Increase tegretol to 200 mg 2 tablets tid Referral to neurologist for second opinion when patient gives me the green light: Dr. Colbert Coyer in [place]. She will try the increased dose of Tegretol over the next 48 hours.   Would she like to see Dr Sharene Skeans?

## 2012-11-24 ENCOUNTER — Ambulatory Visit (INDEPENDENT_AMBULATORY_CARE_PROVIDER_SITE_OTHER): Payer: BC Managed Care – PPO | Admitting: Family Medicine

## 2012-11-24 ENCOUNTER — Encounter: Payer: Self-pay | Admitting: Family Medicine

## 2012-11-24 VITALS — BP 132/74 | HR 61 | Temp 98.2°F | Resp 16 | Ht 60.5 in | Wt 114.0 lb

## 2012-11-24 DIAGNOSIS — I10 Essential (primary) hypertension: Secondary | ICD-10-CM

## 2012-11-24 DIAGNOSIS — F419 Anxiety disorder, unspecified: Secondary | ICD-10-CM

## 2012-11-24 DIAGNOSIS — R7989 Other specified abnormal findings of blood chemistry: Secondary | ICD-10-CM

## 2012-11-24 DIAGNOSIS — E871 Hypo-osmolality and hyponatremia: Secondary | ICD-10-CM

## 2012-11-24 DIAGNOSIS — R945 Abnormal results of liver function studies: Secondary | ICD-10-CM

## 2012-11-24 DIAGNOSIS — F411 Generalized anxiety disorder: Secondary | ICD-10-CM

## 2012-11-24 DIAGNOSIS — G500 Trigeminal neuralgia: Secondary | ICD-10-CM

## 2012-11-24 LAB — CBC WITH DIFFERENTIAL/PLATELET
Eosinophils Absolute: 0.1 10*3/uL (ref 0.0–0.7)
Hemoglobin: 13.5 g/dL (ref 12.0–15.0)
Lymphocytes Relative: 22 % (ref 12–46)
Lymphs Abs: 1.2 10*3/uL (ref 0.7–4.0)
Monocytes Relative: 5 % (ref 3–12)
Neutro Abs: 3.8 10*3/uL (ref 1.7–7.7)
Neutrophils Relative %: 70 % (ref 43–77)
Platelets: 333 10*3/uL (ref 150–400)
RBC: 4.38 MIL/uL (ref 3.87–5.11)
WBC: 5.3 10*3/uL (ref 4.0–10.5)

## 2012-11-24 MED ORDER — CITALOPRAM HYDROBROMIDE 20 MG PO TABS: 20.0000 mg | ORAL_TABLET | Freq: Every day | ORAL | Status: DC

## 2012-11-24 NOTE — Progress Notes (Signed)
[address]   [place], [place]  [address]   [phone]  Subjective:    Patient ID: Lacey Burke, female    DOB: 1946/07/10, 67 y.o.   MRN: [id number]  HPI This 67 y.o. female presents for evaluation of trigeminal neuralgia.  Pain worsened after last visit; Tegretol level elevated at last visit.  Due to worsening pain, evaluated by Dr. Milus Glazier; agreed to increase to six tablets daily.  S/p neurology consultation by Dr. Sharene Skeans; prescribed Baclofen and Neurontin in addition of Tegretol six tablets daily.  Started 300mg  Gabapentin; then increased to 600mg .  Also taking Baclofen tid.  Symptoms still severe.  Yesterday, developed shaking, blurred vision but symptoms passed.  Emailed neurologist with update.   Current dose:  6 Tegretol, Gabapentin 600mg  with supper.  Baclofen 3 daily.   Still having pain despite increased medication.  Severity 3-5/10 initially; recently can escalate to 7/10 . Triggers are talking, eating, washing face.   Day that she called neurologist, was at school talking on the phone; felt like a tazor that lasted 10 seconds.   Pain is a nerve pain.  Sharp stabbing quick pain.  Having multiple episodes per day depending on eating, talking.   Son was researching, Raelyn Mora only helpful for a few years.  Discussed with neurologist; referred to neurosurgeon to discuss.  Success rate at [facility] not as great as in [place].   Brain surgery not indicated after age 54. Five years, [place] was quiet. No triggering event.  Follow-up with neuro is 12/17/12.  [facility].   No labs for neurologist.  2.  HTN:  Blood pressure stable.  Compliance with medication; good tolerance to medication; good symptom control.  3. Hyponatremia:  Improved at last visit. Denies dizziness, nausea, fatigue.  Unclear why sodium was low.  4. Elevated LFTs;: has improved lately.  Normal readings for past several blood draws.     Review of Systems  Constitutional: Negative for fever, chills,  diaphoresis and fatigue.  Respiratory: Negative for shortness of breath, wheezing and stridor.   Cardiovascular: Negative for chest pain, palpitations and leg swelling.  Gastrointestinal: Negative for nausea, vomiting, abdominal pain, diarrhea and constipation.  Skin: Negative for color change, pallor and rash.  Neurological: Positive for headaches. Negative for dizziness, tremors, seizures, syncope, facial asymmetry, speech difficulty, weakness, light-headedness and numbness.  Psychiatric/Behavioral: Negative for suicidal ideas, sleep disturbance, self-injury and dysphoric mood. The patient is nervous/anxious.     Past Medical History  Diagnosis Date  . Trigeminal neuralgia 07/15/1996  . Osteoporosis   . Allergy     Zyrtec daily.  . Anxiety   . Blood transfusion without reported diagnosis 07/15/1980    SAB at second trimester with IUD; required transfusion.  Marland Kitchen Heart murmur     childhood onset.  Marland Kitchen Hypertension 07/15/1998  . Cataract     Past Surgical History  Procedure Laterality Date  . Tonsilectomy, adenoidectomy, bilateral myringotomy and tubes    . Cosmetic surgery    . Breast surgery      Augmentation in 1970's/Implant removal 1990's  . Nose surgery  07/15/1978  . Abdominal hysterectomy  2007    Fibroids; ovaries resected.    Prior to Admission medications   Medication Sig Start Date End Date Taking? Authorizing Provider  baclofen (LIORESAL) 10 MG tablet Take 10 mg by mouth 3 (three) times daily.   Yes Historical Provider, MD  Calcium-Vitamin D-Vitamin K 750-500-40 MG-UNT-MCG TABS Take by mouth daily.   Yes Historical Provider, MD  carbamazepine (TEGRETOL) 200 MG tablet Take 2 tablets (400 mg total) by mouth 3 (three) times daily. 11/08/12  Yes Elvina Sidle, MD  cetirizine (ZYRTEC) 10 MG tablet Take 10 mg by mouth daily.   Yes Historical Provider, MD  citalopram (CELEXA) 20 MG tablet Take 1 tablet (20 mg total) by mouth daily. 11/24/12  Yes Ethelda Chick, MD  Coenzyme Q10 (CO  Q 10 PO) Take 1 tablet by mouth daily.   Yes Historical Provider, MD  DIGESTIVE ENZYMES PO Take 1 tablet by mouth daily.   Yes Historical Provider, MD  gabapentin (NEURONTIN) 600 MG tablet Take 600 mg by mouth 3 (three) times daily.   Yes Historical Provider, MD  Glucosamine HCl 1500 MG TABS Take by mouth. 1500mg  glucosamine, 1500 chondroitin, with MSM   Yes Historical Provider, MD  magnesium oxide (MAG-OX) 400 MG tablet Take 400 mg by mouth daily.   Yes Historical Provider, MD  Multiple Vitamin (MULTIVITAMIN) tablet Take 1 tablet by mouth daily.   Yes Historical Provider, MD  NON FORMULARY 1 tablet daily. curamin   Yes Historical Provider, MD  polyethylene glycol (MIRALAX / GLYCOLAX) packet Take 17 g by mouth daily.   Yes Historical Provider, MD  Probiotic Product (PROBIOTIC DAILY PO) Take 1 tablet by mouth daily.   Yes Historical Provider, MD  EPINEPHrine (EPIPEN) 0.3 mg/0.3 mL DEVI Inject 0.3 mLs (0.3 mg total) into the muscle once. 11/06/11   Rickard Patience, PA-C  metoprolol succinate (TOPROL-XL) 50 MG 24 hr tablet take 1/2 tablet by mouth daily 12/23/12   Ethelda Chick, MD  metroNIDAZOLE (METROGEL) 1 % gel Apply 1 application topically daily.    Historical Provider, MD    Allergies  Allergen Reactions  . Bee Venom     History   Social History  . Marital Status: Single    Spouse Name: N/A    Number of Children: N/A  . Years of Education: N/A   Occupational History  . Not on file.   Social History Main Topics  . Smoking status: Former Games developer  . Smokeless tobacco: Not on file  . Alcohol Use: Yes     Comment: seldom  . Drug Use: No  . Sexually Active: No   Other Topics Concern  . Not on file   Social History Narrative   Marital: divorced since 1994 after 20 years of marriage; not dating and not interested.       Children: 2 children/sons (37, 29); no grandchildren.      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  Plans to retire in 11/2012.      Tobacco: quit in college.      Alcohol:  Socially wine.      Drugs: none      Exercise:  Three days per week; jogging and walking. Computer programs stengthening.    Family History  Problem Relation Age of Onset  . Obesity Mother   . Diabetes Mother   . Hypertension Mother   . Hyperlipidemia Mother   . Diabetes Father   . Arthritis Father     Spinal stenosis  . Irritable bowel syndrome Son   . GER disease Son   . Mental illness Sister     anxiety  . Mental illness Sister     anxiety  . Arthritis Sister 50    spinal stenosis       Objective:  Physical Exam  Nursing note and vitals reviewed. Constitutional: She is oriented to person, place, and time. She appears well-developed and well-nourished. No distress.  HENT:  Head: Normocephalic and atraumatic.  Eyes: Conjunctivae and EOM are normal. Pupils are equal, round, and reactive to light.  Neck: Normal range of motion. Neck supple. No thyromegaly present.  Cardiovascular: Normal rate, regular rhythm, normal heart sounds and intact distal pulses.   Pulmonary/Chest: Effort normal and breath sounds normal. She has no wheezes. She has no rales.  Abdominal: Soft. Bowel sounds are normal. There is no tenderness. There is no rebound.  Lymphadenopathy:    She has no cervical adenopathy.  Neurological: She is alert and oriented to person, place, and time. No cranial nerve deficit. She exhibits normal muscle tone. Coordination normal.  Skin: Skin is warm and dry. She is not diaphoretic.  Psychiatric: She has a normal mood and affect. Her behavior is normal.       Assessment & Plan:  Abnormal liver function tests - Plan: Comprehensive metabolic panel  Trigeminal neuralgia - Plan: CBC with Differential, Carbamazepine level, total  Hypertension  Anxiety  Hyponatremia - Plan: Comprehensive metabolic panel

## 2012-11-25 ENCOUNTER — Telehealth: Payer: Self-pay | Admitting: Radiology

## 2012-11-25 LAB — CARBAMAZEPINE LEVEL, TOTAL: Carbamazepine Lvl: 15.2 ug/mL — ABNORMAL HIGH (ref 4.0–12.0)

## 2012-11-25 LAB — COMPREHENSIVE METABOLIC PANEL
ALT: 27 U/L (ref 0–35)
Albumin: 4.3 g/dL (ref 3.5–5.2)
CO2: 23 mEq/L (ref 19–32)
Glucose, Bld: 110 mg/dL — ABNORMAL HIGH (ref 70–99)
Potassium: 4 mEq/L (ref 3.5–5.3)
Sodium: 135 mEq/L (ref 135–145)
Total Bilirubin: 0.4 mg/dL (ref 0.3–1.2)
Total Protein: 6.1 g/dL (ref 6.0–8.3)

## 2012-11-25 NOTE — Telephone Encounter (Signed)
Lab called about her tegretol level, it is elevated to you, please advise.

## 2012-12-01 ENCOUNTER — Encounter: Payer: Self-pay | Admitting: Family Medicine

## 2012-12-01 NOTE — Telephone Encounter (Signed)
Aware of Tegretol level; have communicated with patient via MyChart.

## 2012-12-23 ENCOUNTER — Other Ambulatory Visit: Payer: Self-pay | Admitting: Physician Assistant

## 2013-01-12 DIAGNOSIS — E871 Hypo-osmolality and hyponatremia: Secondary | ICD-10-CM | POA: Insufficient documentation

## 2013-01-12 NOTE — Assessment & Plan Note (Signed)
Worsening; s/p neurology consultation; Baclofen and Neurontin added; obtain labs.  Close follow-up with neurology.

## 2013-01-12 NOTE — Assessment & Plan Note (Signed)
Controlled no change in management. 

## 2013-01-12 NOTE — Assessment & Plan Note (Signed)
Improving; repeat today.

## 2013-01-12 NOTE — Assessment & Plan Note (Signed)
Improved; repeat labs today.

## 2013-01-12 NOTE — Assessment & Plan Note (Signed)
Stable despite worsening trigeminal neuralgia.  Continue current medications.

## 2013-01-21 DIAGNOSIS — G500 Trigeminal neuralgia: Secondary | ICD-10-CM | POA: Diagnosis not present

## 2013-02-08 HISTORY — PX: COLONOSCOPY: SHX174

## 2013-02-12 HISTORY — PX: HAND SURGERY: SHX662

## 2013-02-17 ENCOUNTER — Other Ambulatory Visit: Payer: Self-pay

## 2013-02-24 ENCOUNTER — Ambulatory Visit: Payer: BC Managed Care – PPO

## 2013-02-24 ENCOUNTER — Ambulatory Visit (INDEPENDENT_AMBULATORY_CARE_PROVIDER_SITE_OTHER): Payer: Medicare Other | Admitting: Family Medicine

## 2013-02-24 VITALS — BP 124/80 | HR 81 | Temp 98.3°F | Ht 60.0 in | Wt 118.8 lb

## 2013-02-24 DIAGNOSIS — M79642 Pain in left hand: Secondary | ICD-10-CM

## 2013-02-24 DIAGNOSIS — M79609 Pain in unspecified limb: Secondary | ICD-10-CM | POA: Diagnosis not present

## 2013-02-24 DIAGNOSIS — S62609A Fracture of unspecified phalanx of unspecified finger, initial encounter for closed fracture: Secondary | ICD-10-CM

## 2013-02-24 DIAGNOSIS — M25549 Pain in joints of unspecified hand: Secondary | ICD-10-CM | POA: Diagnosis not present

## 2013-02-24 NOTE — Progress Notes (Signed)
Patients left shoulder abrasion is cleansed with soap and water, double antibiotic ointment is applied.

## 2013-02-24 NOTE — Patient Instructions (Addendum)
You can go today for appt with Dr Farris Has at [facility], they are located at [facility]  Arrive at [facility] for your 3:30 appointment. Take your xray CD with you

## 2013-02-24 NOTE — Progress Notes (Signed)
This 68 year old retired woman with trigeminal rales on the right side of her face. She was out jogging this morning and during her recovery phase she tripped and fell injuring her left hand she fell. She scraped her left shoulder as well.  She has pain over the head of the middle finger MCP with some deformity as well. She has decreased range of motion of the lateral 3 fingers.  Objective: Swollen third fourth and fifth fingers at the MCP joints of corresponding ecchymosis. She is unable to make a fist and is holding her hand in partial finger flexion. She's tender over the MCP joints as well. [facility] reading (PRIMARY) by  Dr. Milus Glazier:  Right hand.  Middle, ring and pinky fingers have fractures in the proximal phalanges.  The worst is the middle finger which is comminuted and shows 20 degrees dorsal angulation of the distal shaft  Assessment: Finger fractures, closed with some angulation of the middle finger proximal phalanx  Plan: Orthopedic referral today Hand splint  Signed, Sheila Oats.D.

## 2013-02-25 DIAGNOSIS — G500 Trigeminal neuralgia: Secondary | ICD-10-CM | POA: Diagnosis not present

## 2013-02-25 DIAGNOSIS — G43009 Migraine without aura, not intractable, without status migrainosus: Secondary | ICD-10-CM | POA: Diagnosis not present

## 2013-03-01 DIAGNOSIS — IMO0002 Reserved for concepts with insufficient information to code with codable children: Secondary | ICD-10-CM | POA: Diagnosis not present

## 2013-03-01 DIAGNOSIS — Y929 Unspecified place or not applicable: Secondary | ICD-10-CM | POA: Diagnosis not present

## 2013-03-01 DIAGNOSIS — S62309A Unspecified fracture of unspecified metacarpal bone, initial encounter for closed fracture: Secondary | ICD-10-CM | POA: Diagnosis not present

## 2013-03-01 DIAGNOSIS — X58XXXA Exposure to other specified factors, initial encounter: Secondary | ICD-10-CM | POA: Diagnosis not present

## 2013-03-01 DIAGNOSIS — Y939 Activity, unspecified: Secondary | ICD-10-CM | POA: Diagnosis not present

## 2013-03-01 DIAGNOSIS — Y999 Unspecified external cause status: Secondary | ICD-10-CM | POA: Diagnosis not present

## 2013-03-10 ENCOUNTER — Ambulatory Visit (INDEPENDENT_AMBULATORY_CARE_PROVIDER_SITE_OTHER): Payer: Medicare Other | Admitting: Family Medicine

## 2013-03-10 ENCOUNTER — Encounter: Payer: Self-pay | Admitting: Family Medicine

## 2013-03-10 VITALS — BP 140/88 | HR 71 | Temp 98.1°F | Resp 16 | Ht 60.0 in | Wt 116.6 lb

## 2013-03-10 DIAGNOSIS — I10 Essential (primary) hypertension: Secondary | ICD-10-CM | POA: Diagnosis not present

## 2013-03-10 DIAGNOSIS — Z79899 Other long term (current) drug therapy: Secondary | ICD-10-CM

## 2013-03-10 DIAGNOSIS — F411 Generalized anxiety disorder: Secondary | ICD-10-CM | POA: Diagnosis not present

## 2013-03-10 DIAGNOSIS — G500 Trigeminal neuralgia: Secondary | ICD-10-CM

## 2013-03-10 DIAGNOSIS — IMO0001 Reserved for inherently not codable concepts without codable children: Secondary | ICD-10-CM | POA: Diagnosis not present

## 2013-03-10 DIAGNOSIS — F419 Anxiety disorder, unspecified: Secondary | ICD-10-CM

## 2013-03-10 LAB — CBC WITH DIFFERENTIAL/PLATELET
Basophils Absolute: 0.1 10*3/uL (ref 0.0–0.1)
Eosinophils Relative: 3 % (ref 0–5)
HCT: 39.5 % (ref 36.0–46.0)
Hemoglobin: 14.3 g/dL (ref 12.0–15.0)
Lymphocytes Relative: 25 % (ref 12–46)
Lymphs Abs: 1.6 10*3/uL (ref 0.7–4.0)
MCV: 85.9 fL (ref 78.0–100.0)
Monocytes Absolute: 0.4 10*3/uL (ref 0.1–1.0)
Neutro Abs: 4.1 10*3/uL (ref 1.7–7.7)
RBC: 4.6 MIL/uL (ref 3.87–5.11)
WBC: 6.3 10*3/uL (ref 4.0–10.5)

## 2013-03-10 NOTE — Patient Instructions (Signed)
1.  Please check blood pressure 1-2 times per week; please call if blood pressure frequently > 140/90. 2.  Please return in six months for complete physical exam.

## 2013-03-10 NOTE — Progress Notes (Signed)
[address]   [place], [place]  [address]   [phone]  Subjective:    Patient ID: Lacey Burke, female    DOB: January 21, 1946, 67 y.o.   MRN: [id number]  HPI This 67 y.o. female presents for three month follow-up:  1.  Phalanx fractures x 4:  S/p Margarita Rana.  Wears cast for additional three weeks; evaluated by Eulah Pont this morning.  Follow-up in three more weeks.  2. Trigeminal Neuralgia:  Recent follow-up with [facility].  Referred to [facility] for NVD; maximized medication; s/p evaluation by [facility]; second opinion this week by [facility].  Researching extensively.  The pain shield; technology out of [place]; device that you wear on your face; ultrasound impulses to face and wear overnight; less invasive approach; originated in 2006.   Catalina Lunger is now decreasing medication; 4 Tegretol and 4 Gabapentin; recently added 2 Nortriptyline.  Takes Baclofen as needed for pain.  Took 2 Baclofen yesterday; took 3 the day before.   Needs Tegretol levels.    3. HTN: elevated today; has not checked at home recently; normal BP readings at all recent office visits.  Reports good compliance with medication, good tolerance to medication; good symptom control.  4.  L mandible indentation: tender slightly for the past two days.  Noticed yesterday.  Concerned about bony degeneration at site; no injury.  No teeth or gum inflammation or pain.  No cold symptoms.  No parotid/facial swelling.  5.  L 2nd-3rd toes darkening:    No pain; onset in past few weeks; not sure of etiology; did fall recently with exercise; not sure if injured toes.  No swelling of toes; only has noticed toenails purple and brown.  6. Anxiety and depression:  Stable despite [place]; compliance with medication; handling stress of daily pain.  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  Good family support.  Review of Systems  Constitutional: Negative for fever, chills,  diaphoresis and fatigue.  Respiratory: Negative for shortness of breath, wheezing and stridor.   Cardiovascular: Negative for chest pain, palpitations and leg swelling.  Neurological: Negative for dizziness, tremors, seizures, syncope, facial asymmetry, speech difficulty, weakness, light-headedness, numbness and headaches.  Psychiatric/Behavioral: Negative for sleep disturbance and dysphoric mood. The patient is not nervous/anxious.     Past Medical History  Diagnosis Date  . Trigeminal neuralgia 07/15/1996  . Osteoporosis   . Allergy     Zyrtec daily.  . Anxiety   . Blood transfusion without reported diagnosis 07/15/1980    SAB at second trimester with IUD; required transfusion.  Marland Kitchen Heart murmur     childhood onset.  Marland Kitchen Hypertension 07/15/1998  . Cataract     Past Surgical History  Procedure Laterality Date  . Tonsilectomy, adenoidectomy, bilateral myringotomy and tubes    . Cosmetic surgery    . Breast surgery      Augmentation in 1970's/Implant removal 1990's  . Nose surgery  07/15/1978  . Abdominal hysterectomy  2007    Fibroids; ovaries resected.    Prior to Admission medications   Medication Sig Start Date End Date Taking? Authorizing Provider  ALOE VERA PO Take by mouth daily.   Yes Historical Provider, MD  baclofen (LIORESAL) 10 MG tablet Take 10 mg by mouth 3 (three) times daily.   Yes Historical Provider, MD  Calcium-Vitamin D-Vitamin K 750-500-40 MG-UNT-MCG TABS Take by mouth daily.   Yes Historical Provider, MD  carbamazepine (TEGRETOL)  200 MG tablet Take 2 tablets (400 mg total) by mouth 3 (three) times daily. 11/08/12  Yes Elvina Sidle, MD  citalopram (CELEXA) 20 MG tablet Take 1 tablet (20 mg total) by mouth daily. 11/24/12  Yes Ethelda Chick, MD  DIGESTIVE ENZYMES PO Take 1 tablet by mouth daily.   Yes Historical Provider, MD  EPINEPHrine (EPIPEN) 0.3 mg/0.3 mL DEVI Inject 0.3 mLs (0.3 mg total) into the muscle once. 11/06/11  Yes Rickard Patience, PA-C  gabapentin  (NEURONTIN) 600 MG tablet Take 600 mg by mouth 3 (three) times daily.   Yes Historical Provider, MD  Glucosamine HCl 1500 MG TABS Take by mouth. 1500mg  glucosamine, 1500 chondroitin, with MSM   Yes Historical Provider, MD  magnesium oxide (MAG-OX) 400 MG tablet Take 400 mg by mouth daily.   Yes Historical Provider, MD  metoprolol succinate (TOPROL-XL) 50 MG 24 hr tablet take 1/2 tablet by mouth daily 12/23/12  Yes Ethelda Chick, MD  metroNIDAZOLE (METROGEL) 1 % gel Apply 1 application topically daily.   Yes Historical Provider, MD  Multiple Vitamin (MULTIVITAMIN) tablet Take 1 tablet by mouth daily.   Yes Historical Provider, MD  NON FORMULARY 1 tablet daily. curamin   Yes Historical Provider, MD  nortriptyline (PAMELOR) 25 MG capsule Take 25 mg by mouth at bedtime.   Yes Historical Provider, MD  polyethylene glycol (MIRALAX / GLYCOLAX) packet Take 17 g by mouth daily.   Yes Historical Provider, MD  Probiotic Product (PROBIOTIC DAILY PO) Take 1 tablet by mouth daily.   Yes Historical Provider, MD  cetirizine (ZYRTEC) 10 MG tablet Take 10 mg by mouth daily.    Historical Provider, MD  Coenzyme Q10 (CO Q 10 PO) Take 1 tablet by mouth daily.    Historical Provider, MD    Allergies  Allergen Reactions  . Bee Venom     History   Social History  . Marital Status: Single    Spouse Name: N/A    Number of Children: N/A  . Years of Education: N/A   Occupational History  . Not on file.   Social History Main Topics  . Smoking status: Former Games developer  . Smokeless tobacco: Not on file  . Alcohol Use: No     Comment: seldom  . Drug Use: No  . Sexual Activity: No   Other Topics Concern  . Not on file   Social History Narrative   Marital: divorced since 1994 after 20 years of marriage; not dating and not interested.       Children: 2 children/sons (37, 29); no grandchildren.      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      Tobacco: quit in college.      Alcohol:  Socially wine.      Drugs: none      Exercise:  Three days per week; jogging and walking. Computer programs stengthening.    Family History  Problem Relation Age of Onset  . Obesity Mother   . Diabetes Mother   . Hypertension Mother   . Hyperlipidemia Mother   . Diabetes Father   . Arthritis Father     Spinal stenosis  . Irritable bowel syndrome Son   . GER disease Son   . Mental illness Sister     anxiety  . Mental illness Sister     anxiety  .  Arthritis Sister 50    spinal stenosis       Objective:   Physical Exam  Nursing note and vitals reviewed. Constitutional: She is oriented to person, place, and time. She appears well-developed and well-nourished. No distress.  HENT:  Head: Normocephalic and atraumatic.  B mandibles with indentation laterally with more severe indentation along L lateral mandible; slightly TTP on L.  No cervical LAD; no parotid swelling or tenderness; no preauricular LAD or tenderness.    Eyes: Conjunctivae are normal. Pupils are equal, round, and reactive to light.  Neck: Normal range of motion. Neck supple. No thyromegaly present.  Cardiovascular: Normal rate, regular rhythm, normal heart sounds and intact distal pulses.  Exam reveals no gallop and no friction rub.   No murmur heard. Pulmonary/Chest: Effort normal and breath sounds normal.  Abdominal: Soft. Bowel sounds are normal. She exhibits no distension and no mass. There is no tenderness. There is no rebound and no guarding.  Neurological: She is alert and oriented to person, place, and time.  Skin: No rash noted. She is not diaphoretic.  L foot 2nd and 3rd toes with discoloration and purple coloring of nails diffusely.  Mild thickening of nails.  Psychiatric: She has a normal mood and affect. Her behavior is normal. Thought content normal.       Assessment & Plan:  High risk medications (not anticoagulants)  long-term use - Plan: Carbamazepine level, total, CBC with Differential, Comprehensive metabolic panel  Trigeminal neuralgia  Anxiety  Hypertension   1.  HTN: worsening today; recommend monitoring BP twice weekly at home; call if BP remains > 140/90.   2.  Anxiety:  Controlled; no change in medications. 3.  Trigeminal Neuralgia:  Worsening; followed closely by neurology; appointment at [facility] for second opinion this week.   4.  High risk medication management: stable; obtain labs. 5.  Nail discoloration/trauma:  New.  Secondary to trauma; reassurance provided; monitor; expect resolution as nails grow. 6.  L mandibular pain:  New.  No trauma; no associated LAD; no teeth or gum pathology; if persists, consider mandible films.  RTC if worsens.  Meds ordered this encounter  Medications  . ALOE VERA PO    Sig: Take by mouth daily.

## 2013-03-11 ENCOUNTER — Encounter: Payer: Self-pay | Admitting: Family Medicine

## 2013-03-11 LAB — COMPREHENSIVE METABOLIC PANEL
ALT: 35 U/L (ref 0–35)
AST: 35 U/L (ref 0–37)
Albumin: 4.3 g/dL (ref 3.5–5.2)
BUN: 12 mg/dL (ref 6–23)
CO2: 28 mEq/L (ref 19–32)
Calcium: 9.8 mg/dL (ref 8.4–10.5)
Chloride: 99 mEq/L (ref 96–112)
Creat: 0.54 mg/dL (ref 0.50–1.10)
Potassium: 4.3 mEq/L (ref 3.5–5.3)

## 2013-03-11 LAB — CARBAMAZEPINE LEVEL, TOTAL: Carbamazepine Lvl: 6.5 ug/mL (ref 4.0–12.0)

## 2013-03-12 DIAGNOSIS — G500 Trigeminal neuralgia: Secondary | ICD-10-CM | POA: Diagnosis not present

## 2013-03-16 ENCOUNTER — Encounter: Payer: Self-pay | Admitting: Family Medicine

## 2013-03-24 ENCOUNTER — Telehealth: Payer: Self-pay

## 2013-03-24 NOTE — Telephone Encounter (Signed)
Please advise, see email, I think she is taking 1 and 1/2 tablets but rx indicates 1/2 tablet, please advise clarify pended for one and one half

## 2013-03-24 NOTE — Telephone Encounter (Signed)
Patient needs refill on BP med metoprolol. [facility].  [phone]

## 2013-03-25 MED ORDER — METOPROLOL SUCCINATE ER 50 MG PO TB24: ORAL_TABLET | ORAL | Status: DC

## 2013-03-25 NOTE — Telephone Encounter (Signed)
Rx sent to pharmacy for 1.5 tablets daily.

## 2013-03-31 DIAGNOSIS — IMO0001 Reserved for inherently not codable concepts without codable children: Secondary | ICD-10-CM | POA: Diagnosis not present

## 2013-04-14 DIAGNOSIS — IMO0001 Reserved for inherently not codable concepts without codable children: Secondary | ICD-10-CM | POA: Diagnosis not present

## 2013-04-15 DIAGNOSIS — M25649 Stiffness of unspecified hand, not elsewhere classified: Secondary | ICD-10-CM | POA: Diagnosis not present

## 2013-04-15 DIAGNOSIS — M6281 Muscle weakness (generalized): Secondary | ICD-10-CM | POA: Diagnosis not present

## 2013-04-15 DIAGNOSIS — M7989 Other specified soft tissue disorders: Secondary | ICD-10-CM | POA: Diagnosis not present

## 2013-04-15 DIAGNOSIS — IMO0002 Reserved for concepts with insufficient information to code with codable children: Secondary | ICD-10-CM | POA: Diagnosis not present

## 2013-04-20 DIAGNOSIS — M7989 Other specified soft tissue disorders: Secondary | ICD-10-CM | POA: Diagnosis not present

## 2013-04-20 DIAGNOSIS — M6281 Muscle weakness (generalized): Secondary | ICD-10-CM | POA: Diagnosis not present

## 2013-04-20 DIAGNOSIS — M25649 Stiffness of unspecified hand, not elsewhere classified: Secondary | ICD-10-CM | POA: Diagnosis not present

## 2013-04-20 DIAGNOSIS — IMO0002 Reserved for concepts with insufficient information to code with codable children: Secondary | ICD-10-CM | POA: Diagnosis not present

## 2013-04-22 DIAGNOSIS — M7989 Other specified soft tissue disorders: Secondary | ICD-10-CM | POA: Diagnosis not present

## 2013-04-22 DIAGNOSIS — IMO0002 Reserved for concepts with insufficient information to code with codable children: Secondary | ICD-10-CM | POA: Diagnosis not present

## 2013-04-22 DIAGNOSIS — M6281 Muscle weakness (generalized): Secondary | ICD-10-CM | POA: Diagnosis not present

## 2013-04-22 DIAGNOSIS — M25649 Stiffness of unspecified hand, not elsewhere classified: Secondary | ICD-10-CM | POA: Diagnosis not present

## 2013-04-27 DIAGNOSIS — IMO0002 Reserved for concepts with insufficient information to code with codable children: Secondary | ICD-10-CM | POA: Diagnosis not present

## 2013-04-27 DIAGNOSIS — M6281 Muscle weakness (generalized): Secondary | ICD-10-CM | POA: Diagnosis not present

## 2013-04-27 DIAGNOSIS — M7989 Other specified soft tissue disorders: Secondary | ICD-10-CM | POA: Diagnosis not present

## 2013-04-27 DIAGNOSIS — M25649 Stiffness of unspecified hand, not elsewhere classified: Secondary | ICD-10-CM | POA: Diagnosis not present

## 2013-04-29 DIAGNOSIS — M6281 Muscle weakness (generalized): Secondary | ICD-10-CM | POA: Diagnosis not present

## 2013-04-29 DIAGNOSIS — M25649 Stiffness of unspecified hand, not elsewhere classified: Secondary | ICD-10-CM | POA: Diagnosis not present

## 2013-04-29 DIAGNOSIS — M7989 Other specified soft tissue disorders: Secondary | ICD-10-CM | POA: Diagnosis not present

## 2013-04-29 DIAGNOSIS — IMO0002 Reserved for concepts with insufficient information to code with codable children: Secondary | ICD-10-CM | POA: Diagnosis not present

## 2013-05-04 DIAGNOSIS — Z01818 Encounter for other preprocedural examination: Secondary | ICD-10-CM | POA: Diagnosis not present

## 2013-05-04 DIAGNOSIS — G500 Trigeminal neuralgia: Secondary | ICD-10-CM | POA: Diagnosis not present

## 2013-05-04 DIAGNOSIS — I498 Other specified cardiac arrhythmias: Secondary | ICD-10-CM | POA: Diagnosis not present

## 2013-05-04 DIAGNOSIS — Z87898 Personal history of other specified conditions: Secondary | ICD-10-CM | POA: Insufficient documentation

## 2013-05-04 DIAGNOSIS — G9389 Other specified disorders of brain: Secondary | ICD-10-CM | POA: Diagnosis not present

## 2013-05-06 DIAGNOSIS — IMO0002 Reserved for concepts with insufficient information to code with codable children: Secondary | ICD-10-CM | POA: Diagnosis not present

## 2013-05-06 DIAGNOSIS — M6281 Muscle weakness (generalized): Secondary | ICD-10-CM | POA: Diagnosis not present

## 2013-05-06 DIAGNOSIS — M25649 Stiffness of unspecified hand, not elsewhere classified: Secondary | ICD-10-CM | POA: Diagnosis not present

## 2013-05-06 DIAGNOSIS — M7989 Other specified soft tissue disorders: Secondary | ICD-10-CM | POA: Diagnosis not present

## 2013-05-11 DIAGNOSIS — M7989 Other specified soft tissue disorders: Secondary | ICD-10-CM | POA: Diagnosis not present

## 2013-05-11 DIAGNOSIS — M25649 Stiffness of unspecified hand, not elsewhere classified: Secondary | ICD-10-CM | POA: Diagnosis not present

## 2013-05-11 DIAGNOSIS — IMO0002 Reserved for concepts with insufficient information to code with codable children: Secondary | ICD-10-CM | POA: Diagnosis not present

## 2013-05-11 DIAGNOSIS — M6281 Muscle weakness (generalized): Secondary | ICD-10-CM | POA: Diagnosis not present

## 2013-05-12 ENCOUNTER — Telehealth: Payer: Self-pay

## 2013-05-12 NOTE — Telephone Encounter (Signed)
What was the previous dosage, we have already resent for one and one half tablet. Called, she did not answer. I then called the pharmacy. They have corrected this for her. They still had both on file, and were not sure which one was correct. Advised to cancel the one for 1/2 tablet and keep the one that is for 1 and 1/2 tablet.

## 2013-05-12 NOTE — Telephone Encounter (Signed)
Called patient to advise this is done.

## 2013-05-12 NOTE — Telephone Encounter (Signed)
Patient was given a lower dosage of metoprolol than she normally gets and pharmacy will not refill medication this early unless we call it in. Patient is completely out and has not taken any today.

## 2013-05-13 DIAGNOSIS — IMO0002 Reserved for concepts with insufficient information to code with codable children: Secondary | ICD-10-CM | POA: Diagnosis not present

## 2013-05-13 DIAGNOSIS — M6281 Muscle weakness (generalized): Secondary | ICD-10-CM | POA: Diagnosis not present

## 2013-05-13 DIAGNOSIS — M7989 Other specified soft tissue disorders: Secondary | ICD-10-CM | POA: Diagnosis not present

## 2013-05-13 DIAGNOSIS — M25649 Stiffness of unspecified hand, not elsewhere classified: Secondary | ICD-10-CM | POA: Diagnosis not present

## 2013-05-15 HISTORY — PX: OTHER SURGICAL HISTORY: SHX169

## 2013-05-15 HISTORY — PX: BRAIN SURGERY: SHX531

## 2013-05-18 DIAGNOSIS — M7989 Other specified soft tissue disorders: Secondary | ICD-10-CM | POA: Diagnosis not present

## 2013-05-18 DIAGNOSIS — IMO0002 Reserved for concepts with insufficient information to code with codable children: Secondary | ICD-10-CM | POA: Diagnosis not present

## 2013-05-18 DIAGNOSIS — Z23 Encounter for immunization: Secondary | ICD-10-CM | POA: Diagnosis not present

## 2013-05-18 DIAGNOSIS — M6281 Muscle weakness (generalized): Secondary | ICD-10-CM | POA: Diagnosis not present

## 2013-05-18 DIAGNOSIS — M25649 Stiffness of unspecified hand, not elsewhere classified: Secondary | ICD-10-CM | POA: Diagnosis not present

## 2013-05-20 ENCOUNTER — Other Ambulatory Visit: Payer: Self-pay

## 2013-05-20 DIAGNOSIS — M7989 Other specified soft tissue disorders: Secondary | ICD-10-CM | POA: Diagnosis not present

## 2013-05-20 DIAGNOSIS — IMO0002 Reserved for concepts with insufficient information to code with codable children: Secondary | ICD-10-CM | POA: Diagnosis not present

## 2013-05-20 DIAGNOSIS — M25649 Stiffness of unspecified hand, not elsewhere classified: Secondary | ICD-10-CM | POA: Diagnosis not present

## 2013-05-20 DIAGNOSIS — M6281 Muscle weakness (generalized): Secondary | ICD-10-CM | POA: Diagnosis not present

## 2013-05-25 DIAGNOSIS — IMO0002 Reserved for concepts with insufficient information to code with codable children: Secondary | ICD-10-CM | POA: Diagnosis not present

## 2013-05-25 DIAGNOSIS — M25649 Stiffness of unspecified hand, not elsewhere classified: Secondary | ICD-10-CM | POA: Diagnosis not present

## 2013-05-25 DIAGNOSIS — M6281 Muscle weakness (generalized): Secondary | ICD-10-CM | POA: Diagnosis not present

## 2013-05-25 DIAGNOSIS — M7989 Other specified soft tissue disorders: Secondary | ICD-10-CM | POA: Diagnosis not present

## 2013-05-31 DIAGNOSIS — G500 Trigeminal neuralgia: Secondary | ICD-10-CM | POA: Diagnosis not present

## 2013-06-14 ENCOUNTER — Ambulatory Visit (INDEPENDENT_AMBULATORY_CARE_PROVIDER_SITE_OTHER): Payer: Medicare Other | Admitting: Family Medicine

## 2013-06-14 VITALS — BP 110/70 | HR 73 | Temp 97.9°F | Resp 16 | Ht 60.5 in | Wt 118.8 lb

## 2013-06-14 DIAGNOSIS — IMO0001 Reserved for inherently not codable concepts without codable children: Secondary | ICD-10-CM

## 2013-06-14 DIAGNOSIS — T8140XA Infection following a procedure, unspecified, initial encounter: Secondary | ICD-10-CM | POA: Diagnosis not present

## 2013-06-14 MED ORDER — DOXYCYCLINE HYCLATE 100 MG PO TABS: 100.0000 mg | ORAL_TABLET | Freq: Two times a day (BID) | ORAL | Status: DC

## 2013-06-14 NOTE — Progress Notes (Signed)
67 year old woman with trigeminal neuralgia comes in 2 weeks postop decompression of the facial nerve. She underwent a procedure at [facility]. She's had no complaints of pain although she does have some sensitivity of her scalp superior to the incision line.  She's been careful not to wash the area and has developed a crusty exudate over the suture line along with some adjacent erythema.  Objective: Patient has a nontender suture line. All suture was removed (running 4-0 Ethilon). The exudate was gently removed using a cold preps revealing a slightly open suture line and trace of purulent material. This was cultured.  Small amount of Dermabond was applied to reinforce the suture line intermittently.  Assessment: Possible mild cellulitis along the suture line with no evidence for abscess formation or deeper infection.  Plan: Doxycycline 100 mg twice a day and followup with her doctors at [facility], Sheila Oats.D.

## 2013-06-16 LAB — WOUND CULTURE
Gram Stain: NONE SEEN
Gram Stain: NONE SEEN
Gram Stain: NONE SEEN

## 2013-07-09 DIAGNOSIS — G500 Trigeminal neuralgia: Secondary | ICD-10-CM | POA: Diagnosis not present

## 2013-07-22 DIAGNOSIS — G500 Trigeminal neuralgia: Secondary | ICD-10-CM | POA: Diagnosis not present

## 2013-07-23 DIAGNOSIS — R928 Other abnormal and inconclusive findings on diagnostic imaging of breast: Secondary | ICD-10-CM | POA: Diagnosis not present

## 2013-08-24 ENCOUNTER — Other Ambulatory Visit: Payer: Self-pay | Admitting: Family Medicine

## 2013-08-27 DIAGNOSIS — D239 Other benign neoplasm of skin, unspecified: Secondary | ICD-10-CM | POA: Diagnosis not present

## 2013-08-27 DIAGNOSIS — L719 Rosacea, unspecified: Secondary | ICD-10-CM | POA: Diagnosis not present

## 2013-09-06 ENCOUNTER — Ambulatory Visit (INDEPENDENT_AMBULATORY_CARE_PROVIDER_SITE_OTHER): Payer: Medicare Other | Admitting: Family Medicine

## 2013-09-06 ENCOUNTER — Encounter: Payer: Self-pay | Admitting: Family Medicine

## 2013-09-06 VITALS — BP 138/88 | HR 54 | Temp 97.9°F | Resp 16 | Ht 60.0 in | Wt 121.0 lb

## 2013-09-06 DIAGNOSIS — J309 Allergic rhinitis, unspecified: Secondary | ICD-10-CM

## 2013-09-06 DIAGNOSIS — Z0000 Encounter for general adult medical examination without abnormal findings: Secondary | ICD-10-CM

## 2013-09-06 DIAGNOSIS — G500 Trigeminal neuralgia: Secondary | ICD-10-CM

## 2013-09-06 DIAGNOSIS — M949 Disorder of cartilage, unspecified: Secondary | ICD-10-CM

## 2013-09-06 DIAGNOSIS — I10 Essential (primary) hypertension: Secondary | ICD-10-CM | POA: Diagnosis not present

## 2013-09-06 DIAGNOSIS — M899 Disorder of bone, unspecified: Secondary | ICD-10-CM

## 2013-09-06 DIAGNOSIS — F419 Anxiety disorder, unspecified: Secondary | ICD-10-CM

## 2013-09-06 DIAGNOSIS — M810 Age-related osteoporosis without current pathological fracture: Secondary | ICD-10-CM

## 2013-09-06 DIAGNOSIS — F411 Generalized anxiety disorder: Secondary | ICD-10-CM | POA: Diagnosis not present

## 2013-09-06 LAB — COMPLETE METABOLIC PANEL WITH GFR
ALBUMIN: 4.1 g/dL (ref 3.5–5.2)
ALT: 19 U/L (ref 0–35)
AST: 25 U/L (ref 0–37)
Alkaline Phosphatase: 98 U/L (ref 39–117)
BUN: 15 mg/dL (ref 6–23)
CALCIUM: 9.2 mg/dL (ref 8.4–10.5)
CHLORIDE: 106 meq/L (ref 96–112)
CO2: 25 mEq/L (ref 19–32)
Creat: 0.69 mg/dL (ref 0.50–1.10)
GFR, Est African American: >89 mL/min
GFR, Est Non African American: >89 mL/min
Glucose, Bld: 109 mg/dL — ABNORMAL HIGH (ref 70–99)
POTASSIUM: 4 meq/L (ref 3.5–5.3)
SODIUM: 140 meq/L (ref 135–145)
TOTAL PROTEIN: 6.3 g/dL (ref 6.0–8.3)
Total Bilirubin: 0.5 mg/dL (ref 0.2–1.2)

## 2013-09-06 LAB — CBC
HCT: 40 % (ref 36.0–46.0)
Hemoglobin: 14.1 g/dL (ref 12.0–15.0)
MCH: 30.9 pg (ref 26.0–34.0)
MCHC: 35.3 g/dL (ref 30.0–36.0)
MCV: 87.5 fL (ref 78.0–100.0)
PLATELETS: 334 10*3/uL (ref 150–400)
RBC: 4.57 MIL/uL (ref 3.87–5.11)
RDW: 14 % (ref 11.5–15.5)
WBC: 6.1 10*3/uL (ref 4.0–10.5)

## 2013-09-06 LAB — LIPID PANEL
CHOL/HDL RATIO: 2.1 ratio
Cholesterol: 195 mg/dL (ref 0–200)
HDL: 91 mg/dL (ref >39)
LDL Cholesterol: 90 mg/dL (ref 0–99)
Triglycerides: 70 mg/dL (ref <150)
VLDL: 14 mg/dL (ref 0–40)

## 2013-09-06 LAB — POCT URINALYSIS DIPSTICK
BILIRUBIN UA: NEGATIVE
GLUCOSE UA: NEGATIVE
Ketones, UA: NEGATIVE
LEUKOCYTES UA: NEGATIVE
NITRITE UA: NEGATIVE
Protein, UA: NEGATIVE
Spec Grav, UA: 1.02
Urobilinogen, UA: 0.2
pH, UA: 6.5

## 2013-09-06 LAB — HEMOGLOBIN A1C
HEMOGLOBIN A1C: 5.1 % (ref <5.7)
Mean Plasma Glucose: 100 mg/dL (ref <117)

## 2013-09-06 LAB — TSH: TSH: 1.534 u[IU]/mL (ref 0.350–4.500)

## 2013-09-06 MED ORDER — METOPROLOL SUCCINATE ER 50 MG PO TB24: ORAL_TABLET | ORAL | Status: DC

## 2013-09-06 MED ORDER — CITALOPRAM HYDROBROMIDE 20 MG PO TABS: 20.0000 mg | ORAL_TABLET | Freq: Every day | ORAL | Status: DC

## 2013-09-06 MED ORDER — FLUTICASONE PROPIONATE 50 MCG/ACT NA SUSP: 2.0000 | Freq: Every day | NASAL | Status: DC

## 2013-09-06 MED ORDER — EPINEPHRINE 0.3 MG/0.3ML IJ SOAJ: 0.3000 mg | Freq: Once | INTRAMUSCULAR | Status: DC

## 2013-09-06 NOTE — Assessment & Plan Note (Signed)
Controlled; obtain labs, u/a, EKG.  No change in management at this time.  Follow-up in six months.

## 2013-09-06 NOTE — Progress Notes (Signed)
   Subjective:    Patient ID: Lacey Burke, female    DOB: November 09, 1945, 68 y.o.   MRN: [id number]  HPI    Review of Systems  Constitutional: Negative.   HENT: Negative.   Eyes: Negative.   Respiratory: Positive for cough.   Cardiovascular: Negative.   Gastrointestinal: Negative.   Endocrine: Negative.   Genitourinary: Negative.   Musculoskeletal: Negative.   Skin: Negative.   Allergic/Immunologic: Positive for environmental allergies.  Neurological: Negative.   Hematological: Negative.   Psychiatric/Behavioral: Negative.        Objective:   Physical Exam        Assessment & Plan:

## 2013-09-06 NOTE — Progress Notes (Signed)
Subjective:    Patient ID: Lacey Burke, female    DOB: [birth year], 68 y.o.   MRN: [id number]  09/06/2013  Annual Exam   HPI This 68 y.o. female presents for evaluation of Annual Wellness Exam.  Last physical exam 09/01/12. Pap smear 09/2012 [facility]. Mammogram 07/2013.  [facility]. Colonoscopy 02/08/2013 normal. Lacey Burke. Repeat in 10 years.   Bone density scan refused in 2014; not interested in scan this year. TDAP not sure. Pneumovax never but agreeable. Zostavax never but will consider. Flu vaccine 04/2013. Eye exam 09/2012.  +glasses.  No glaucoma; +early cataracts.  [facility] Burke. Dental exam several years ago.  Lacey Burke.     Trigeminal Neuralgia:  S/p microvascular surgery in 05/2013.  Had it at [facility].  Off of all medication.  No Baclofen, Gabapentin, or Tegretol.  No major recovery issues.  Has gained weight.  Now exercising.  Weight is up 11 pounds.  No persistent pain; very pleased with results.  HTN:  Was checking BP at home but now is not; will restart.  No other medication for BP in past.  Denies CP/palp/SOB/leg swelling.  Denies HA/dizziness/focal weakness/paresthesias.  Anxiety and depression: stable on Citalopram.  Doing well emotionally.  Retired in the past year so adjusting to retirement.      Allergies:  Now having itchy symptoms, PND, cough, nasal congestion.  Switched from Zyrtec to [employer].  Symptoms better than they were one month ago.  No previous allergy testing.  Has a dog but has had him for years.  Interested in allergy testing.    Review of Systems  Constitutional: Negative.   HENT: Negative.   Eyes: Negative.   Respiratory: Positive for cough.   Cardiovascular: Negative.   Gastrointestinal: Negative.   Endocrine: Negative.   Genitourinary: Negative.   Musculoskeletal: Negative.   Skin: Negative.   Allergic/Immunologic: Positive for environmental allergies.  Neurological: Negative.   Psychiatric/Behavioral: Negative.     Past Medical History  Diagnosis  Date  . Trigeminal neuralgia 07/15/1996  . Osteoporosis   . Allergy     Zyrtec daily.  . Anxiety   . Blood transfusion without reported diagnosis 07/15/1980    SAB at second trimester with IUD; required transfusion.  Marland Kitchen Heart murmur     childhood onset.  Marland Kitchen Hypertension 07/15/1998  . Cataract   . GERD (gastroesophageal reflux disease)    Allergies  Allergen Reactions  . Bee Venom    Current Outpatient Prescriptions  Medication Sig Dispense Refill  . Calcium-Vitamin D-Vitamin K [id number] MG-UNT-MCG TABS Take by mouth daily.      . citalopram (CELEXA) 20 MG tablet Take 1 tablet (20 mg total) by mouth daily.  30 tablet  11  . Coenzyme Q10 (CO Q 10 PO) Take 1 tablet by mouth daily.      Marland Kitchen DIGESTIVE ENZYMES PO Take 1 tablet by mouth daily.      Marland Kitchen EPINEPHrine (EPIPEN) 0.3 mg/0.3 mL DEVI Inject 0.3 mLs (0.3 mg total) into the muscle once.  1 Device  0  . Fexofenadine HCl (ALLEGRA PO) Take 180 mg by mouth daily.      . Garlic 938 MG CAPS Take by mouth daily.      . Glucosamine HCl 1500 MG TABS Take by mouth. 1500mg  glucosamine, 1500 chondroitin, with MSM      . magnesium oxide (MAG-OX) 400 MG tablet Take 400 mg by mouth daily.      . metoprolol succinate (TOPROL-XL) 50 MG 24 hr tablet take 1 and 1/2 tablet  by mouth daily  45 tablet  5  . metroNIDAZOLE (METROGEL) 1 % gel Apply 1 application topically daily.      . Multiple Vitamin (MULTIVITAMIN) tablet Take 1 tablet by mouth daily.      . NON FORMULARY 1 tablet daily. curamin      . polyethylene glycol (MIRALAX / GLYCOLAX) packet Take 17 g by mouth daily.      . Probiotic Product (PROBIOTIC DAILY PO) Take 1 tablet by mouth daily.      . ALOE VERA PO Take by mouth daily.      . baclofen (LIORESAL) 10 MG tablet Take 10 mg by mouth 3 (three) times daily.      . carbamazepine (TEGRETOL) 200 MG tablet take 2 tablets by mouth three times a day  180 tablet  3  . cetirizine (ZYRTEC) 10 MG tablet Take 10 mg by mouth daily.      Marland Kitchen doxycycline  (VIBRA-TABS) 100 MG tablet Take 1 tablet (100 mg total) by mouth 2 (two) times daily.  20 tablet  0  . gabapentin (NEURONTIN) 600 MG tablet Take 600 mg by mouth 3 (three) times daily.      . nortriptyline (PAMELOR) 25 MG capsule Take 25 mg by mouth at bedtime.       No current facility-administered medications for this visit.   History   Social History  . Marital Status: Single    Spouse Name: N/A    Number of Children: 2  . Years of Education: N/A   Occupational History  . retired [employer]   Social History Main Topics  . Smoking status: Former Games developer  . Smokeless tobacco: Never Used  . Alcohol Use: 1.8 oz/week    3 Glasses of wine per week     Comment: seldom  . Drug Use: No  . Sexual Activity: No   Other Topics Concern  . Not on file   Social History Narrative   Marital: divorced since 1994 after 20 years of marriage; not dating and not interested.       Children: 2 children/sons (38, 30); no grandchildren.      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       Tobacco:  Former smoker; quit in college.      Alcohol:  Socially wine 3 nights per week.      Drugs: none      Exercise:  Three days per week; walking. Computer programs stengthening.      ADLs: independent with all ADLs; drives.   Family History  Problem Relation Age of Onset  . Obesity Mother   . Diabetes Mother   . Hypertension Mother   . Hyperlipidemia Mother   . Dementia Mother   . Diabetes Father   . Arthritis Father     Spinal stenosis  . Irritable bowel syndrome Son   . GER disease Son   . Mental illness Sister     anxiety  . Mental illness Sister     anxiety  . Arthritis Sister 50    spinal stenosis       Objective:    BP 138/88  Pulse 54  Temp(Src) 97.9 F (36.6 C) (Oral)  Resp 16  Ht 5' (1.524 m)  Wt 121 lb (54.885 kg)  BMI 23.63 kg/m2  SpO2 98% Physical Exam    Nursing note and  vitals reviewed. Constitutional: She is oriented to person, place, and time. She appears well-developed and well-nourished. No distress.  HENT:  Head: Normocephalic and atraumatic.  Right Ear: External ear normal.  Left Ear: External ear normal.  Nose: Nose normal.  Mouth/Throat: Oropharynx is clear and moist.  Eyes: Conjunctivae and EOM are normal. Pupils are equal, round, and reactive to light.  Neck: Normal range of motion and full passive range of motion without pain. Neck supple. No JVD present. Carotid bruit is not present. No thyromegaly present.  Cardiovascular: Normal rate, regular rhythm and normal heart sounds.  Exam reveals no gallop and no friction rub.   No murmur heard. Pulmonary/Chest: Effort normal and breath sounds normal. She has no wheezes. She has no rales.  Abdominal: Soft. Bowel sounds are normal. She exhibits no distension and no mass. There is no tenderness. There is no rebound and no guarding.  Musculoskeletal:       Right shoulder: Normal.       Left shoulder: Normal.       Cervical back: Normal.  Lymphadenopathy:    She has no cervical adenopathy.  Neurological: She is alert and oriented to person, place, and time. She has normal reflexes. No cranial nerve deficit. She exhibits normal muscle tone. Coordination normal.  Skin: Skin is warm and dry. No rash noted. She is not diaphoretic. No erythema. No pallor.  Psychiatric: She has a normal mood and affect. Her behavior is normal. Judgment and thought content normal.   Results for orders placed in visit on 09/06/13  POCT URINALYSIS DIPSTICK      Result Value Ref Range   Color, UA yellow     Clarity, UA clear     Glucose, UA neg     Bilirubin, UA neg     Ketones, UA neg     Spec Grav, UA 1.020     Blood, UA trace     pH, UA 6.5     Protein, UA neg     Urobilinogen, UA 0.2     Nitrite, UA neg     Leukocytes, UA Negative     EKG: sinus bradycardia at 51; no ST changes.    Assessment  & Plan:  Routine general medical examination at a health care facility - Plan: CBC, POCT urinalysis dipstick, COMPLETE METABOLIC PANEL WITH GFR, Lipid panel, TSH, EKG 12-Lead, Hemoglobin A1c  Essential hypertension, benign - Plan: CBC, POCT urinalysis dipstick, COMPLETE METABOLIC PANEL WITH GFR, EKG 12-Lead  Disorder of bone and cartilage, unspecified  Meds ordered this encounter  Medications  . Garlic 224 MG CAPS    Sig: Take by mouth daily.  Marland Kitchen Fexofenadine HCl (ALLEGRA PO)    Sig: Take 180 mg by mouth daily.    No Follow-up on file.

## 2013-09-06 NOTE — Assessment & Plan Note (Signed)
Worsening; continue Allegra; rx for Flonase provided.

## 2013-09-06 NOTE — Assessment & Plan Note (Signed)
Improved s/p surgical procedure at [facility]; has successfully weaned off of Baclofen, Neurontin, Tegretol.

## 2013-09-06 NOTE — Assessment & Plan Note (Signed)
Controlled; no change in medication at this time.  Refills provided.  Follow-up in six months.

## 2013-09-06 NOTE — Assessment & Plan Note (Signed)
Anticipatory guidance provided ---- ASA 81mg  daily.  Pap smear and mammogram UTD.  Colonoscopy UTD.  Pt refused bone density scan.  Pt refused Pneumovax.  Has not received Influenza vaccine.  Independent with ADLs. No hearing loss. Low fall risk.  Being treated for depression/anxiety.

## 2013-09-06 NOTE — Assessment & Plan Note (Signed)
Stable; pt refuses repeat bone density scanning and refuses bisphoshonate treatment.  Continue calcium supplementation and exercise.

## 2013-09-07 ENCOUNTER — Encounter: Payer: Self-pay | Admitting: Family Medicine

## 2013-09-13 ENCOUNTER — Encounter: Payer: Self-pay | Admitting: Family Medicine

## 2013-10-05 IMAGING — CR DG HAND COMPLETE 3+V*L*
2 series · 2 of 2 positions shown · non-contrast
Comparison: None.

CLINICAL DATA: Hand pain

LEFT HAND - COMPLETE 3+ VIEW

[PA]
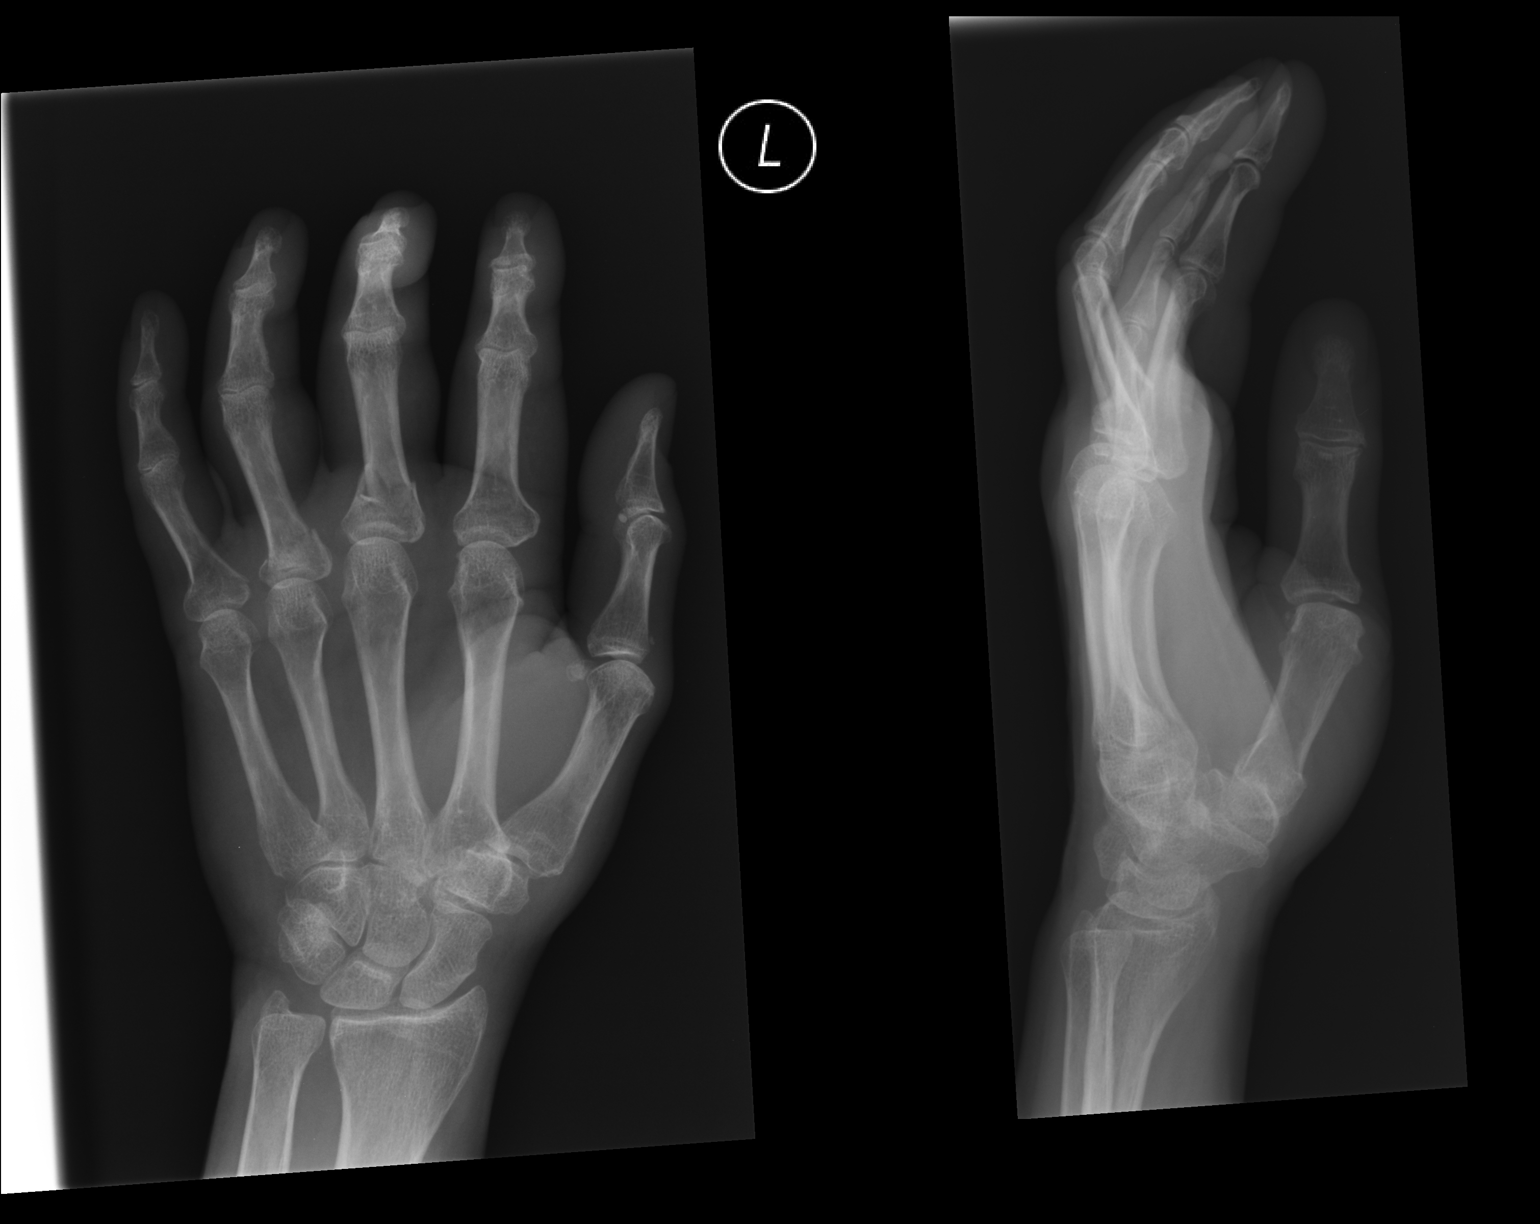

[lateral]
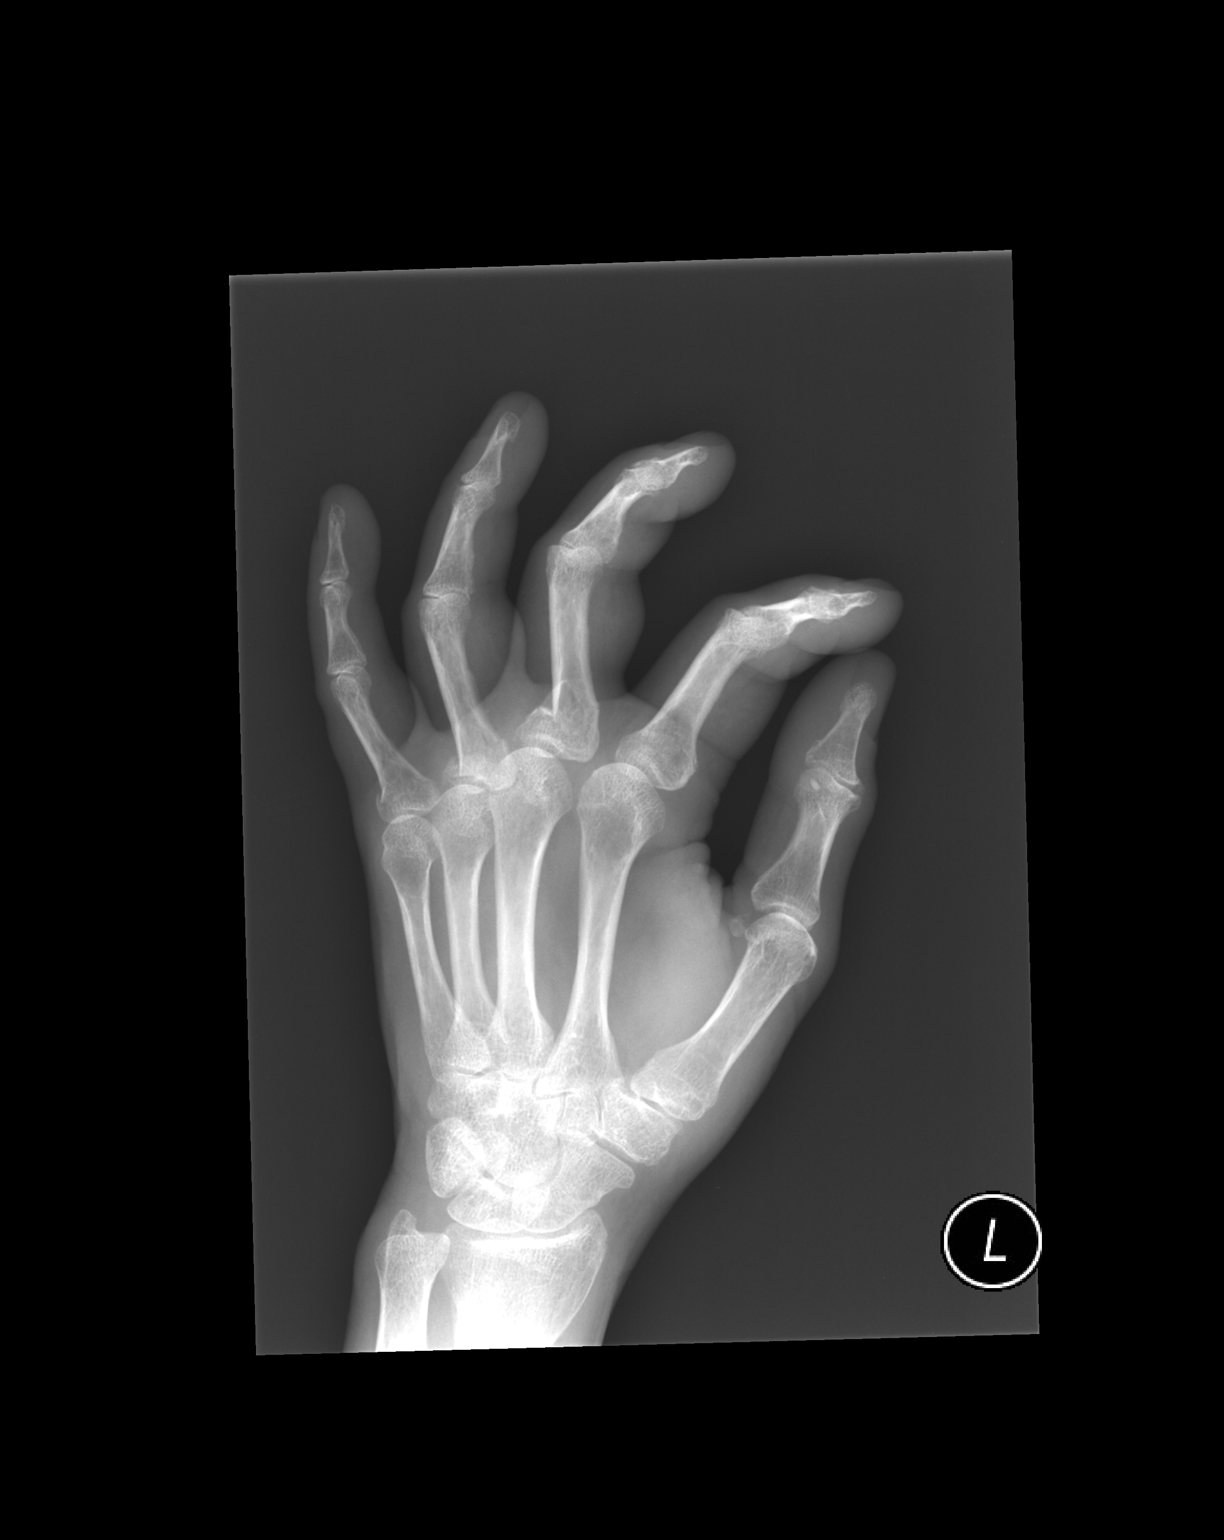

[2 of 2 positions shown; findings below may reference images not displayed]

FINDINGS: The fractures at the base of the third, fourth, and fifth
proximal phalanges.  The fractures demonstrate dorsal angulation
and appear to enter the articular surfaces. No nondisplaced
fracture the base of the second proximal phalanx.
IMPRESSION: Intrarticular fracture at the base of the third, fourth, and fifth
proximal phalanges.

Nondisplaced fracture the base of the second proximal phalanx.

Clinically significant discrepancy from primary report, if
provided: None

## 2013-11-19 DIAGNOSIS — H2510 Age-related nuclear cataract, unspecified eye: Secondary | ICD-10-CM | POA: Diagnosis not present

## 2013-11-19 DIAGNOSIS — H26019 Infantile and juvenile cortical, lamellar, or zonular cataract, unspecified eye: Secondary | ICD-10-CM | POA: Diagnosis not present

## 2013-12-01 DIAGNOSIS — H26019 Infantile and juvenile cortical, lamellar, or zonular cataract, unspecified eye: Secondary | ICD-10-CM | POA: Diagnosis not present

## 2013-12-01 DIAGNOSIS — H2510 Age-related nuclear cataract, unspecified eye: Secondary | ICD-10-CM | POA: Diagnosis not present

## 2013-12-22 DIAGNOSIS — H26019 Infantile and juvenile cortical, lamellar, or zonular cataract, unspecified eye: Secondary | ICD-10-CM | POA: Diagnosis not present

## 2013-12-22 DIAGNOSIS — H2510 Age-related nuclear cataract, unspecified eye: Secondary | ICD-10-CM | POA: Diagnosis not present

## 2013-12-29 DIAGNOSIS — H2510 Age-related nuclear cataract, unspecified eye: Secondary | ICD-10-CM | POA: Diagnosis not present

## 2013-12-29 DIAGNOSIS — H26019 Infantile and juvenile cortical, lamellar, or zonular cataract, unspecified eye: Secondary | ICD-10-CM | POA: Diagnosis not present

## 2014-01-17 DIAGNOSIS — M810 Age-related osteoporosis without current pathological fracture: Secondary | ICD-10-CM | POA: Diagnosis not present

## 2014-01-17 DIAGNOSIS — Z124 Encounter for screening for malignant neoplasm of cervix: Secondary | ICD-10-CM | POA: Diagnosis not present

## 2014-03-04 DIAGNOSIS — M25579 Pain in unspecified ankle and joints of unspecified foot: Secondary | ICD-10-CM | POA: Diagnosis not present

## 2014-03-04 DIAGNOSIS — M79609 Pain in unspecified limb: Secondary | ICD-10-CM | POA: Diagnosis not present

## 2014-03-04 DIAGNOSIS — M659 Synovitis and tenosynovitis, unspecified: Secondary | ICD-10-CM | POA: Diagnosis not present

## 2014-03-04 DIAGNOSIS — M65979 Unspecified synovitis and tenosynovitis, unspecified ankle and foot: Secondary | ICD-10-CM | POA: Diagnosis not present

## 2014-03-07 ENCOUNTER — Ambulatory Visit (INDEPENDENT_AMBULATORY_CARE_PROVIDER_SITE_OTHER): Payer: Medicare Other | Admitting: Family Medicine

## 2014-03-07 ENCOUNTER — Encounter: Payer: Self-pay | Admitting: Family Medicine

## 2014-03-07 VITALS — BP 130/86 | HR 47 | Temp 97.7°F | Resp 16 | Ht 60.5 in | Wt 115.8 lb

## 2014-03-07 DIAGNOSIS — M810 Age-related osteoporosis without current pathological fracture: Secondary | ICD-10-CM

## 2014-03-07 DIAGNOSIS — I10 Essential (primary) hypertension: Secondary | ICD-10-CM | POA: Diagnosis not present

## 2014-03-07 DIAGNOSIS — Z23 Encounter for immunization: Secondary | ICD-10-CM | POA: Diagnosis not present

## 2014-03-07 DIAGNOSIS — S6390XA Sprain of unspecified part of unspecified wrist and hand, initial encounter: Secondary | ICD-10-CM

## 2014-03-07 DIAGNOSIS — G500 Trigeminal neuralgia: Secondary | ICD-10-CM

## 2014-03-07 DIAGNOSIS — G4700 Insomnia, unspecified: Secondary | ICD-10-CM

## 2014-03-07 DIAGNOSIS — S63601A Unspecified sprain of right thumb, initial encounter: Secondary | ICD-10-CM

## 2014-03-07 DIAGNOSIS — F419 Anxiety disorder, unspecified: Secondary | ICD-10-CM

## 2014-03-07 DIAGNOSIS — F411 Generalized anxiety disorder: Secondary | ICD-10-CM

## 2014-03-07 LAB — COMPREHENSIVE METABOLIC PANEL
ALBUMIN: 4.6 g/dL (ref 3.5–5.2)
ALT: 21 U/L (ref 0–35)
AST: 24 U/L (ref 0–37)
Alkaline Phosphatase: 82 U/L (ref 39–117)
BUN: 15 mg/dL (ref 6–23)
CHLORIDE: 104 meq/L (ref 96–112)
CO2: 26 meq/L (ref 19–32)
Calcium: 9.9 mg/dL (ref 8.4–10.5)
Creat: 0.71 mg/dL (ref 0.50–1.10)
GLUCOSE: 99 mg/dL (ref 70–99)
POTASSIUM: 4.3 meq/L (ref 3.5–5.3)
SODIUM: 138 meq/L (ref 135–145)
TOTAL PROTEIN: 6.7 g/dL (ref 6.0–8.3)
Total Bilirubin: 0.5 mg/dL (ref 0.2–1.2)

## 2014-03-07 MED ORDER — MELOXICAM 7.5 MG PO TABS: 7.5000 mg | ORAL_TABLET | Freq: Every day | ORAL | Status: DC

## 2014-03-07 MED ORDER — HYDROCHLOROTHIAZIDE 12.5 MG PO TABS: 12.5000 mg | ORAL_TABLET | Freq: Every day | ORAL | Status: DC

## 2014-03-07 MED ORDER — ZOSTER VACCINE LIVE 19400 UNT/0.65ML ~~LOC~~ SOLR: 0.6500 mL | Freq: Once | SUBCUTANEOUS | Status: DC

## 2014-03-07 NOTE — Progress Notes (Signed)
Subjective:    Patient ID: Lacey Burke, female    DOB: 04-13-1946, 68 y.o.   MRN: [id number]  03/07/2014  Follow-up, Hypertension and Anxiety   HPI This 68 y.o. female presents for six month follow-up:  1. HTN: six month follow-up; no changes to management made at last visit.  Has been reading about health prevention; read that Metoprolol can affect memory; stated that other B blocker did not contribute to memory loss.  Recommended calcium channel blocker.  Has been on Metoprolol for years; never been on other medication.  Onset in 41s late.  Has never taken anything other than Metoprolol.   Has been checking BP at home; hsa been running 120s-130s/70-80s. Pulse ranges 40s-60s.   Denies CP/palp/SOB/leg swelling. Denies HA/dizziness/focal weakness/paresthesias.  2.  Depression with anxiety: emotionally doing well. Now that retired, mood is excellent.  Would like to try to wean off of medication.  Has tried to wean off of medication but was a stressful time.  Now seems more appropriate.    3.  R thumb swelling: joint sore.  Not improving.  Onset three weeks ago after knitting alot. No improvement with cessation of knitting.  No true trauma just overuse.  No n/t in thumb or hand.  No wrist pain.  R thumb is slightly swollen and definitely bigger than L thumb.    3. Insomnia:  Having difficulty staying asleep.  No major stressors.  No work so no stressors.  After dinner, eats less than one ounce of chocolate.  Not drinking tea at night.  Exercising walking in mornings one hour.  Walking 4-8 miles per day.  No late exercise.  Onset several months.  Has not tried Melatonin.    4.  Trigeminal Neuralgia:  No recurrent pain since surgical procedure; doing really well.    5.  Osteoporosis: agreed to have bone density scan with gynecology.    6. Immunizations:  Did not receive Zostavax but would like new rx.     Review of Systems  Constitutional: Negative for fever, chills, diaphoresis and fatigue.    Eyes: Negative for visual disturbance.  Respiratory: Negative for cough and shortness of breath.   Cardiovascular: Negative for chest pain, palpitations and leg swelling.  Gastrointestinal: Negative for nausea, vomiting, abdominal pain, diarrhea and constipation.  Endocrine: Negative for cold intolerance, heat intolerance, polydipsia, polyphagia and polyuria.  Musculoskeletal: Positive for arthralgias and joint swelling.  Neurological: Negative for dizziness, tremors, seizures, syncope, facial asymmetry, speech difficulty, weakness, light-headedness, numbness and headaches.  Psychiatric/Behavioral: Positive for sleep disturbance. Negative for suicidal ideas, self-injury, dysphoric mood and decreased concentration. The patient is not nervous/anxious.     Past Medical History  Diagnosis Date  . Osteoporosis   . Allergy     Zyrtec daily.  . Anxiety   . Blood transfusion without reported diagnosis 07/15/1980    SAB at second trimester with IUD; required transfusion.  Marland Kitchen Heart murmur     childhood onset.  Marland Kitchen Hypertension 07/15/1998  . Cataract   . GERD (gastroesophageal reflux disease)   . Trigeminal neuralgia 07/15/1996    s/p surgical procedure 05/15/2013 [facility].   Past Surgical History  Procedure Laterality Date  . Tonsilectomy, adenoidectomy, bilateral myringotomy and tubes    . Cosmetic surgery    . Breast surgery      Augmentation in 1970's/Implant removal 1990's  . Nose surgery  07/15/1978  . Abdominal hysterectomy  2007    Fibroids; ovaries resected.  . Hand surgery Left august 2014  .  Surgical procedure for trigeminal neuralgia  05/15/2013    [facility]  . Colonoscopy  02/08/2013    normal.  Repeat in 10 years.  [name].  . Brain surgery  05/15/2013    surgical procedure for trigeminal neuralgia. [facility]   Allergies  Allergen Reactions  . Bee Venom Rash    ON LEGS   Current Outpatient Prescriptions  Medication Sig Dispense Refill  . Azelaic Acid (FINACEA EX) Apply topically at  bedtime.      . Calcium Carb-Cholecalciferol (CALCIUM + D3 PO) Take by mouth daily.      . citalopram (CELEXA) 20 MG tablet Take 1 tablet (20 mg total) by mouth daily.  30 tablet  11  . Coenzyme Q10 (CO Q 10 PO) Take 1 tablet by mouth daily.      Marland Kitchen DIGESTIVE ENZYMES PO Take 1 tablet by mouth daily.      Marland Kitchen EPINEPHrine (EPIPEN 2-PAK) 0.3 mg/0.3 mL SOAJ injection Inject 0.3 mLs (0.3 mg total) into the muscle once.  2 Device  1  . Fexofenadine HCl (ALLEGRA PO) Take 180 mg by mouth daily.      . Garlic 277 MG CAPS Take by mouth daily.      . Glucosamine HCl 1500 MG TABS Take by mouth. 1500mg  glucosamine, 1500 chondroitin, with MSM      . magnesium oxide (MAG-OX) 400 MG tablet Take 400 mg by mouth daily.      . metoprolol succinate (TOPROL-XL) 50 MG 24 hr tablet take 1 and 1/2 tablet by mouth daily  45 tablet  11  . Misc Natural Products (TART CHERRY ADVANCED) CAPS Take by mouth at bedtime.      . Multiple Vitamin (MULTIVITAMIN) tablet Take 1 tablet by mouth daily.      . NON FORMULARY 1 tablet daily. curamin      . polyethylene glycol (MIRALAX / GLYCOLAX) packet Take 17 g by mouth daily.      . Probiotic Product (PROBIOTIC DAILY PO) Take 1 tablet by mouth daily.      . Calcium-Vitamin D-Vitamin K [id number] MG-UNT-MCG TABS Take by mouth daily.      . fluticasone (FLONASE) 50 MCG/ACT nasal spray Place 2 sprays into both nostrils daily.  16 g  11  . hydrochlorothiazide (HYDRODIURIL) 12.5 MG tablet Take 1 tablet (12.5 mg total) by mouth daily.  30 tablet  5  . meloxicam (MOBIC) 7.5 MG tablet Take 1 tablet (7.5 mg total) by mouth daily.  30 tablet  0  . zoster vaccine live, PF, (ZOSTAVAX) 14431 UNT/0.65ML injection Inject 19,400 Units into the skin once.  0.65 mL  0   No current facility-administered medications for this visit.   History   Social History  . Marital Status: Single    Spouse Name: N/A    Number of Children: 2  . Years of Education: N/A   Occupational History  . retired [employer]   Social History Main Topics  . Smoking status: Former Research scientist (life sciences)  . Smokeless tobacco: Never Used  . Alcohol Use: 1.8 oz/week    3 Glasses of wine per week     Comment: seldom  . Drug Use: No  . Sexual Activity: No   Other Topics Concern  . Not on file   Social History Narrative   Marital: divorced since 1994 after 33 years of marriage; not dating and not interested.       Children: 2 children/sons (38, 30); no grandchildren.  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       Tobacco:  Former smoker; quit in college.      Alcohol:  Socially wine 3 nights per week.      Drugs: none      Exercise:  Three days per week; walking. Computer programs stengthening.      ADLs: independent with all ADLs; drives.   Family History  Problem Relation Age of Onset  . Obesity Mother   . Diabetes Mother   . Hypertension Mother   . Hyperlipidemia Mother   . Dementia Mother   . Diabetes Father   . Arthritis Father     Spinal stenosis  . Irritable bowel syndrome Son   . GER disease Son   . Mental illness Sister     anxiety  . Mental illness Sister     anxiety  . Arthritis Sister 50    spinal stenosis       Objective:    BP 130/86  Pulse 47  Temp(Src) 97.7 F (36.5 C) (Oral)  Resp 16  Ht 5' 0.5" (1.537 m)  Wt 115 lb 12.8 oz (52.527 kg)  BMI 22.23 kg/m2  SpO2 98% Physical Exam  Constitutional: She is oriented to person, place, and time. She appears well-developed and well-nourished. No distress.  HENT:  Head: Normocephalic and atraumatic.  Right Ear: External ear normal.  Left Ear: External ear normal.  Nose: Nose normal.  Mouth/Throat: Oropharynx is clear and moist.  Eyes: Conjunctivae and EOM are normal. Pupils are equal, round, and reactive to light.  Neck: Normal range of motion. Neck supple. Carotid bruit is not present. No thyromegaly present.   Cardiovascular: Normal rate, regular rhythm, normal heart sounds and intact distal pulses.  Exam reveals no gallop and no friction rub.   No murmur heard. Pulmonary/Chest: Effort normal and breath sounds normal. She has no wheezes. She has no rales.  Abdominal: Soft. Bowel sounds are normal. She exhibits no distension and no mass. There is no tenderness. There is no rebound and no guarding.  Musculoskeletal:  R HAND: Thumb with slight swelling diffusely especially at MCP joint.  Full extension and flexion at MCP and IP joints yet with pain; no laxity of IP or MCP joints.  Finkelstein positive.  Grip 5/5. R WRIST: no swelling; non-tender to palpation; full ROM without pain or limitation.  Lymphadenopathy:    She has no cervical adenopathy.  Neurological: She is alert and oriented to person, place, and time. No cranial nerve deficit.  Skin: Skin is warm and dry. No rash noted. She is not diaphoretic. No erythema. No pallor.  Psychiatric: She has a normal mood and affect. Her behavior is normal.   Results for orders placed in visit on 03/07/14  VITAMIN D 25 HYDROXY      Result Value Ref Range   Vit D, 25-Hydroxy 61  30 - 89 ng/mL  COMPREHENSIVE METABOLIC PANEL      Result Value Ref Range   Sodium 138  135 - 145 mEq/L   Potassium 4.3  3.5 - 5.3 mEq/L   Chloride 104  96 - 112 mEq/L   CO2 26  19 - 32 mEq/L   Glucose, Bld 99  70 - 99 mg/dL   BUN 15  6 - 23 mg/dL   Creat 0.71  0.50 - 1.10 mg/dL   Total Bilirubin 0.5  0.2 -  1.2 mg/dL   Alkaline Phosphatase 82  39 - 117 U/L   AST 24  0 - 37 U/L   ALT 21  0 - 35 U/L   Total Protein 6.7  6.0 - 8.3 g/dL   Albumin 4.6  3.5 - 5.2 g/dL   Calcium 9.9  8.4 - 10.5 mg/dL       Assessment & Plan:   1. Osteoporosis, unspecified   2. Need for Zostavax administration   3. Trigeminal neuralgia   4. Osteoporosis   5. Essential hypertension   6. Anxiety   7. Sprain, thumb, right, initial encounter   8. Insomnia    1. Osteoporosis: stable; has  agreed to undergo bone density scan yet will not start bisphosphonate.  Obtain Vitamin  D level and normal.  Continue exercise and calcium plus D supplementation. 2.  HTN: controlled; agreeable to wean off Metoprolol; rx for HCTZ 12.5mg  one tablet daily.  Email BP readings in one month.   3.  Anxiety: improved; agreeable to weaning medication over the next two weeks; if mood worsens, can restart medication.  Much less stress with retirement.  4.  Trigeminal neuralgia: improved/resolved with surgical procedure at [facility]. 5.  Rx for Shingles vaccine provided. 6.  R thumb sprain:  New.  Rx for Mobic 7.5mg  one tablet daily provided; recommend icing thumb bid for one week; recommend continued rest of thumb. If persists, will warrant xray. 7.  Insomnia: New.  No underlying anxiety or depression or stressors.  Recommend trial of OTC Melatonin.  Not drinking caffeine in evenings; exercising in mornings.    Meds ordered this encounter  Medications  . Calcium Carb-Cholecalciferol (CALCIUM + D3 PO)    Sig: Take by mouth daily.  . Misc Natural Products (TART CHERRY ADVANCED) CAPS    Sig: Take by mouth at bedtime.  . Azelaic Acid (FINACEA EX)    Sig: Apply topically at bedtime.  . hydrochlorothiazide (HYDRODIURIL) 12.5 MG tablet    Sig: Take 1 tablet (12.5 mg total) by mouth daily.    Dispense:  30 tablet    Refill:  5  . zoster vaccine live, PF, (ZOSTAVAX) 18299 UNT/0.65ML injection    Sig: Inject 19,400 Units into the skin once.    Dispense:  0.65 mL    Refill:  0  . meloxicam (MOBIC) 7.5 MG tablet    Sig: Take 1 tablet (7.5 mg total) by mouth daily.    Dispense:  30 tablet    Refill:  0    Return in about 6 months (around 09/07/2014) for complete physical examiniation.    Reginia Forts, M.D.  [facility] [address] [place], [place]  [address] [phone] phone [phone] fax

## 2014-03-07 NOTE — Patient Instructions (Signed)
1. Email Dr. Tamala Julian in one month with blood pressure readings. 2. Wean Metoprolol to 1 tablet daily for one week and then decrease to 1/2 tablet daily for one week and then stop. 3.  Wean Citalopram to 1/2 tablet daily for two weeks and then stop. 4. Take Meloxicam one tablet daily for R thumb swelling.

## 2014-03-08 LAB — VITAMIN D 25 HYDROXY (VIT D DEFICIENCY, FRACTURES): VIT D 25 HYDROXY: 61 ng/mL (ref 30–89)

## 2014-03-18 DIAGNOSIS — S92309A Fracture of unspecified metatarsal bone(s), unspecified foot, initial encounter for closed fracture: Secondary | ICD-10-CM | POA: Diagnosis not present

## 2014-03-31 ENCOUNTER — Ambulatory Visit (INDEPENDENT_AMBULATORY_CARE_PROVIDER_SITE_OTHER): Payer: Medicare Other | Admitting: Family Medicine

## 2014-03-31 VITALS — BP 122/82 | HR 70 | Temp 97.7°F | Resp 18 | Ht 61.0 in | Wt 115.6 lb

## 2014-03-31 DIAGNOSIS — M79644 Pain in right finger(s): Secondary | ICD-10-CM

## 2014-03-31 DIAGNOSIS — IMO0002 Reserved for concepts with insufficient information to code with codable children: Secondary | ICD-10-CM | POA: Diagnosis not present

## 2014-03-31 DIAGNOSIS — M6530 Trigger finger, unspecified finger: Secondary | ICD-10-CM | POA: Diagnosis not present

## 2014-03-31 DIAGNOSIS — M79609 Pain in unspecified limb: Secondary | ICD-10-CM

## 2014-03-31 DIAGNOSIS — S63601S Unspecified sprain of right thumb, sequela: Secondary | ICD-10-CM

## 2014-03-31 NOTE — Progress Notes (Signed)
[facility] [address] [place] [address] [phone]  Date:  03/31/2014   Name:  Lacey Burke   DOB:  11-02-1945   MRN:  [id number]  PCP:  Reginia Forts, MD    Chief Complaint: Joint pain   History of Present Illness:  Lacey Burke is a 68 y.o. very pleasant female patient who presents with the following:  She is here today to look at a right thumb pain.  The pain started after she was knitting a lot- not sure if this was the cause as she did not notice any acute onset of pain or acute injury.  She has stopped knitting and has used mobic but this is not helping. It seems to be getting worse.  We did not take x-rays last time, she would rather avoid x-rays if possible.  She also notes some small blisters over the thumb for the last few days  She also is seeing a podiatrist for a left foot injury right now- she has a fracture that occurred with minimal trauma it seems.  Her OBG wants her to get a bone scan but she is not quite sure.   Patient Active Problem List   Diagnosis Date Noted  . Allergic rhinitis 09/06/2013  . Annual physical exam 09/08/2012  . Osteoporosis 02/01/2012  . Anxiety 02/01/2012  . Trigeminal neuralgia 09/10/2011  . Hypertension 09/10/2011    Past Medical History  Diagnosis Date  . Osteoporosis   . Allergy     Zyrtec daily.  . Anxiety   . Blood transfusion without reported diagnosis 07/15/1980    SAB at second trimester with IUD; required transfusion.  Marland Kitchen Heart murmur     childhood onset.  Marland Kitchen Hypertension 07/15/1998  . Cataract   . GERD (gastroesophageal reflux disease)   . Trigeminal neuralgia 07/15/1996    s/p surgical procedure 05/15/2013 [facility].    Past Surgical History  Procedure Laterality Date  . Tonsilectomy, adenoidectomy, bilateral myringotomy and tubes    . Cosmetic surgery    . Breast surgery      Augmentation in 1970's/Implant removal 1990's  . Nose surgery  07/15/1978  . Abdominal hysterectomy  2007    Fibroids;  ovaries resected.  . Hand surgery Left august 2014  . Surgical procedure for trigeminal neuralgia  05/15/2013    [facility]  . Colonoscopy  02/08/2013    normal.  Repeat in 10 years.  [name].  . Brain surgery  05/15/2013    surgical procedure for trigeminal neuralgia. [facility]    History  Substance Use Topics  . Smoking status: Former Research scientist (life sciences)  . Smokeless tobacco: Never Used  . Alcohol Use: 1.8 oz/week    3 Glasses of wine per week     Comment: socailly    Family History  Problem Relation Age of Onset  . Obesity Mother   . Diabetes Mother   . Hypertension Mother   . Hyperlipidemia Mother   . Dementia Mother   . Diabetes Father   . Arthritis Father     Spinal stenosis  . Irritable bowel syndrome Son   . GER disease Son   . Mental illness Sister     anxiety  . Mental illness Sister     anxiety  . Arthritis Sister 50    spinal stenosis    Allergies  Allergen Reactions  . Bee Venom Rash    ON LEGS    Medication list has been reviewed and updated.  Current Outpatient Prescriptions  on File Prior to Visit  Medication Sig Dispense Refill  . Coenzyme Q10 (CO Q 10 PO) Take 1 tablet by mouth daily.      Marland Kitchen DIGESTIVE ENZYMES PO Take 1 tablet by mouth daily.      Marland Kitchen EPINEPHrine (EPIPEN 2-PAK) 0.3 mg/0.3 mL SOAJ injection Inject 0.3 mLs (0.3 mg total) into the muscle once.  2 Device  1  . Fexofenadine HCl (ALLEGRA PO) Take 180 mg by mouth daily.      . Garlic 027 MG CAPS Take 580 mg by mouth daily.       . Glucosamine HCl 1500 MG TABS Take by mouth. 1500mg  glucosamine, 1500 chondroitin, with MSM      . hydrochlorothiazide (HYDRODIURIL) 12.5 MG tablet Take 1 tablet (12.5 mg total) by mouth daily.  30 tablet  5  . magnesium oxide (MAG-OX) 400 MG tablet Take 200 mg by mouth daily.       . meloxicam (MOBIC) 7.5 MG tablet Take 1 tablet (7.5 mg total) by mouth daily.  30 tablet  0  . Multiple Vitamin (MULTIVITAMIN) tablet Take 1 tablet by mouth daily.      . polyethylene glycol (MIRALAX /  GLYCOLAX) packet Take 17 g by mouth daily.      . Probiotic Product (PROBIOTIC DAILY PO) Take 1 tablet by mouth daily.      . Azelaic Acid (FINACEA EX) Apply topically at bedtime.      . citalopram (CELEXA) 20 MG tablet Take 1 tablet (20 mg total) by mouth daily.  30 tablet  11  . fluticasone (FLONASE) 50 MCG/ACT nasal spray Place 2 sprays into both nostrils daily.  16 g  11  . metoprolol succinate (TOPROL-XL) 50 MG 24 hr tablet take 1 and 1/2 tablet by mouth daily  45 tablet  11  . Misc Natural Products (TART CHERRY ADVANCED) CAPS Take by mouth at bedtime.      . NON FORMULARY 1 tablet daily. curamin      . zoster vaccine live, PF, (ZOSTAVAX) 25366 UNT/0.65ML injection Inject 19,400 Units into the skin once.  0.65 mL  0   No current facility-administered medications on file prior to visit.    Review of Systems:  As per HPI- otherwise negative.   Physical Examination: Filed Vitals:   03/31/14 1344  BP: 122/82  Pulse: 70  Temp: 97.7 F (36.5 C)  Resp: 18   Filed Vitals:   03/31/14 1344  Height: 5\' 1"  (1.549 m)  Weight: 115 lb 9.6 oz (52.436 kg)   Body mass index is 21.85 kg/(m^2). Ideal Body Weight: Weight in (lb) to have BMI = 25: 132  GEN: WDWN, NAD, Non-toxic, A & O x 3 HEENT: Atraumatic, Normocephalic. Neck supple. No masses, No LAD. Ears and Nose: No external deformity. CV: RRR, No M/G/R. No JVD. No thrill. No extra heart sounds. PULM: CTA B, no wheezes, crackles, rhonchi. No retractions. No resp. distress. No accessory muscle use. ABD: S, NT, ND, +BS. No rebound. No HSM. EXTR: No c/c/e NEURO Normal gait.  PSYCH: Normally interactive. Conversant. Not depressed or anxious appearing.  Calm demeanor.  Right hand: she has a trigger finger of the right thumb.  Mild tenderness and minimal edema of the 1st MCP joint, with full ROM   Assessment and Plan: Thumb pain, right  Thumb sprain, right, sequela  Trigger finger, acquired  Thumb pain- likely synovisis due to  overuse, also has a trigger finger.  She declines x-rays, but did accept a  thumb spica splint to use as needed.  She does not want to be referred to ortho at this time.  She will let me know if she wants to do anything further.    Signed Lamar Blinks, MD

## 2014-03-31 NOTE — Patient Instructions (Signed)
Use the thumb splint as needed to rest your thumb over the next 2-3 weeks.  Let me know if not is not helpful.  I do not think the blisters on your hand are related to the joint pain but let me know if they get worse.

## 2014-04-01 DIAGNOSIS — S92309A Fracture of unspecified metatarsal bone(s), unspecified foot, initial encounter for closed fracture: Secondary | ICD-10-CM | POA: Diagnosis not present

## 2014-04-05 DIAGNOSIS — Z888 Allergy status to other drugs, medicaments and biological substances status: Secondary | ICD-10-CM | POA: Diagnosis not present

## 2014-04-05 DIAGNOSIS — L089 Local infection of the skin and subcutaneous tissue, unspecified: Secondary | ICD-10-CM | POA: Diagnosis not present

## 2014-04-05 DIAGNOSIS — T148XXA Other injury of unspecified body region, initial encounter: Secondary | ICD-10-CM | POA: Diagnosis not present

## 2014-04-05 DIAGNOSIS — L080 Pyoderma: Secondary | ICD-10-CM | POA: Diagnosis not present

## 2014-04-08 ENCOUNTER — Encounter: Payer: Self-pay | Admitting: Family Medicine

## 2014-04-10 ENCOUNTER — Telehealth: Payer: Self-pay | Admitting: *Deleted

## 2014-04-10 DIAGNOSIS — M6530 Trigger finger, unspecified finger: Secondary | ICD-10-CM

## 2014-04-10 DIAGNOSIS — S63609D Unspecified sprain of unspecified thumb, subsequent encounter: Secondary | ICD-10-CM

## 2014-04-10 NOTE — Telephone Encounter (Signed)
From my chart could not route so I sent it this way. Can respond thru mychart.  Dr. Tamala Julian,  I saw Dr. Edilia Bo regarding my trigger finger and joint pain early in the week. I have a wrist brace which is helping me keep it immobile, and therefore, helping with pain a bit. I developed skin blisters on my thumb which is a reaction to the Meloxacam based on the dermatologist 's diagnosis. It wasn't helping anyway. Dr. Edilia Bo mentioned that I can get a referral to an orthopedist if necessary and I think I am at that place now. If you would recommend someone for me, I would appreciate it. Thanks. Lacey Burke

## 2014-04-11 NOTE — Telephone Encounter (Signed)
Happy to refer pt to ortho.  I have placed referral; I will let pt know via mychart.

## 2014-04-18 DIAGNOSIS — S92325D Nondisplaced fracture of second metatarsal bone, left foot, subsequent encounter for fracture with routine healing: Secondary | ICD-10-CM | POA: Diagnosis not present

## 2014-05-02 DIAGNOSIS — Z961 Presence of intraocular lens: Secondary | ICD-10-CM | POA: Diagnosis not present

## 2014-05-16 ENCOUNTER — Ambulatory Visit (INDEPENDENT_AMBULATORY_CARE_PROVIDER_SITE_OTHER): Payer: Medicare Other | Admitting: Sports Medicine

## 2014-05-16 ENCOUNTER — Encounter: Payer: Self-pay | Admitting: Sports Medicine

## 2014-05-16 VITALS — BP 136/80 | Ht 60.5 in | Wt 111.0 lb

## 2014-05-16 DIAGNOSIS — M6530 Trigger finger, unspecified finger: Secondary | ICD-10-CM

## 2014-05-16 MED ORDER — TRIAMCINOLONE ACETONIDE 10 MG/ML IJ SUSP
5.0000 mg | Freq: Once | INTRAMUSCULAR | Status: AC
  Administered 2014-05-16: 5 mg via INTRA_ARTICULAR

## 2014-05-16 NOTE — Progress Notes (Signed)
   Subjective:    Patient ID: Lacey Burke, female    DOB: 12-27-1945, 68 y.o.   MRN: [id number]  HPI 69yoF with history of hypertension, osteoporosis presents with 6 months of pain and immobility of her R thumb. At first it started catching when she palmar flexed her R thumb. Now she tries to keep the thumb straight all the time because if it bends she has to manually straighten the thumb with her other hand and it hurts with that maneuver. She thinks the R thumb is slightly swollen compared with L. She was crocheting a lot starting apprx 6 months ago and thinks that may have exacerbated current thumb problems. She is R handed. Says she is able to type and do most of her ADLs despite thumb ROM limitations.  She has tried dry needles with a physical therapist to the thumb. She was started on meloxicam by her PCP but she developed a rash, has not been on ibuprofen or tylenol since then. The dry needles helped some pain she was having in her shoulders, but not her thumb. Most recently happened 3 days ago.   She has had some shoulder pain, she has been running some but recently was told she had broken a metatarsal in her foot and to take it easy for a while. She has been trying to run more since she retired last year, is 8 weeks into rest for the above injury. She fell and broke several fingers L hand requiring surgery over a year ago (Dr Fredonia Highland)   Review of Systems No red, hot or swollen joints, no other recent injuries other than the ones list above.    Objective:   Physical Exam  MSK / Neuro: R thumb with limited ROM of IP joint, passive and active, similar to L thumb IP joint. Nodule present over tendon in palmar aspect of proximal thumb that is TTP.   Severely limited ROM with flexion at the R thumb MCP joint, both passive and active. Ext/flex of R thumb otherwise normal. 5/5 hand grip b/l, has some trouble making tight fist L hand.     Assessment & Plan:   68yoF with R thumb trigger  finger. Several weeks ago was still able to flex thumb with catching, now not able to bend thumb without pain with extension.   Discussed pathophysiology, progression, and treatment options with patient. Will try corticosteroid injection today. Has not had this before to this thumb. If symptoms continue, will refer to hand specialist for eval for tendon release.   Procedure note: Risks and benefits in addition to alternative therapies were dicussed with patient, time-out conducted, noted no overlying erythema, induration, or other signs of local infection to the area. After verbal and written consent obtained, the palmar flexeral surface of prox R thumb was cleaned with 3 alcohol swabs. Topical analgesic spray: Ethyl chloride used to the thumb. A 5/8 inch needle used to inject 0.32mL of xylocaine and 0.2mL of corticosteroid Kenalog 10mg ) into tendon sheath. Patient tolerated procedure well. All questions were answered. No immediate complications.   Advised to call if fevers/chills, erythema, induration, drainage, or persistent bleeding.

## 2014-06-06 ENCOUNTER — Ambulatory Visit
Admission: RE | Admit: 2014-06-06 | Discharge: 2014-06-06 | Disposition: A | Discharge status: Home / Self Care | Payer: BC Managed Care – PPO | Source: Ambulatory Visit | Attending: Sports Medicine | Admitting: Sports Medicine

## 2014-06-06 ENCOUNTER — Encounter: Payer: Self-pay | Admitting: Sports Medicine

## 2014-06-06 ENCOUNTER — Ambulatory Visit (INDEPENDENT_AMBULATORY_CARE_PROVIDER_SITE_OTHER): Payer: Medicare Other | Admitting: Sports Medicine

## 2014-06-06 VITALS — BP 122/81 | HR 74 | Ht 61.0 in | Wt 110.0 lb

## 2014-06-06 DIAGNOSIS — S92322D Displaced fracture of second metatarsal bone, left foot, subsequent encounter for fracture with routine healing: Secondary | ICD-10-CM | POA: Diagnosis not present

## 2014-06-06 DIAGNOSIS — M79672 Pain in left foot: Secondary | ICD-10-CM | POA: Diagnosis not present

## 2014-06-06 NOTE — Progress Notes (Signed)
   Subjective:    Patient ID: Lacey Burke, female    DOB: 09-22-1945, 68 y.o.   MRN: [id number]  HPI chief complaint: Left foot pain  Patient comes in today complaining of left foot pain. It sounds like she suffered a stress fracture back in the beginning of September. She was treated at [facility] by Dr.Petery. She was recently released from his care but has noticed some persistent discomfort across the dorsum and plantar aspect of her midfoot. Worse with activity as well as wearing shoes with a heel. No swelling. Pain is intermittent. No numbness or tingling. No pain in her heel. No pain at rest. Prior to her injury in September she had also suffered a previous fifth metatarsal fracture in 2012. This was treated conservatively and she denies pain here today.     Review of Systems     Objective:   Physical Exam Well-developed, well-nourished. No acute distress. Awake alert and oriented 3. Vital signs reviewed.  Left foot: There is tenderness to palpation along the midshaft area of the second metatarsal. Slight pain with metatarsal squeeze. No tenderness to palpation along the metatarsal heads. Fairly neutral arch. Neurovascularly intact distally. Walking without a limp.  X-rays of her left foot including AP, lateral, and oblique views are reviewed. She has abundant callus formation around the fracture site at the distal second metatarsal shaft. Fracture line still slightly evident.       Assessment & Plan:  Left foot pain secondary to healing second metatarsal stress fracture  Patient is instructed to avoid shoes that have any sort of heel elevation such as boots or high heels. She certainly does not need any sort of rigid immobilization but I think she needs to use pain as her guide when returning to activity. I will provide her with an arch strap to use for comfort and I will request the records from Dr.Petery. Patient will follow-up with me in 3-4 weeks for  reevaluation.

## 2014-06-13 DIAGNOSIS — Z23 Encounter for immunization: Secondary | ICD-10-CM | POA: Diagnosis not present

## 2014-06-27 ENCOUNTER — Ambulatory Visit (INDEPENDENT_AMBULATORY_CARE_PROVIDER_SITE_OTHER): Payer: Medicare Other | Admitting: Sports Medicine

## 2014-06-27 ENCOUNTER — Encounter: Payer: Self-pay | Admitting: Sports Medicine

## 2014-06-27 VITALS — BP 127/82 | Ht 61.0 in | Wt 110.0 lb

## 2014-06-27 DIAGNOSIS — M25579 Pain in unspecified ankle and joints of unspecified foot: Secondary | ICD-10-CM

## 2014-06-27 DIAGNOSIS — M84375D Stress fracture, left foot, subsequent encounter for fracture with routine healing: Secondary | ICD-10-CM

## 2014-06-27 NOTE — Progress Notes (Signed)
   Subjective:    Patient ID: Lacey Burke, female    DOB: [birth year], 68 y.o.   MRN: [id number]  HPI  Patient comes in today for follow-up on a second metatarsal stress fracture of her left foot. She is feeling better. She has resumed walking 4 miles a day without any difficulty. She has been wearing a super feet type of insert. Also wearing her arch strap which she finds helpful. She is without complaint.   Review of Systems     Objective:   Physical Exam Well-developed, well-nourished. No acute distress  Left foot: Slight tenderness to palpation over the fracture site but not marked. Negative metatarsal squeeze. No soft tissue swelling. Flexible cavus foot. No vascular intact distally. Walking without a limp.       Assessment & Plan:  3 months status post healing second metatarsal stress fracture, left foot Osteoporosis  X-rays 3 weeks ago showed abundant callus formation around the fracture site. Clinically she is doing very well so I see no need to repeat x-rays. She is interested in trying some custom orthotics but I would first like to make sure that she is comfortable in some green sports insoles with scaphoid pads. She can begin to increase activity as tolerated and I'll see her back in the office in 2 weeks with the plan at that time being to make her custom orthotics. She will follow-up with her primary care physician to discuss treatment for osteoporosis.

## 2014-07-05 DIAGNOSIS — L723 Sebaceous cyst: Secondary | ICD-10-CM | POA: Diagnosis not present

## 2014-07-05 DIAGNOSIS — D239 Other benign neoplasm of skin, unspecified: Secondary | ICD-10-CM | POA: Diagnosis not present

## 2014-07-13 ENCOUNTER — Ambulatory Visit: Payer: Medicare Other | Admitting: Sports Medicine

## 2014-07-25 ENCOUNTER — Ambulatory Visit (INDEPENDENT_AMBULATORY_CARE_PROVIDER_SITE_OTHER): Payer: Medicare Other | Admitting: Sports Medicine

## 2014-07-25 ENCOUNTER — Encounter: Payer: Self-pay | Admitting: Sports Medicine

## 2014-07-25 VITALS — BP 121/51 | HR 71 | Ht 61.0 in | Wt 107.0 lb

## 2014-07-25 DIAGNOSIS — M84375A Stress fracture, left foot, initial encounter for fracture: Secondary | ICD-10-CM | POA: Insufficient documentation

## 2014-07-25 DIAGNOSIS — M810 Age-related osteoporosis without current pathological fracture: Secondary | ICD-10-CM | POA: Diagnosis not present

## 2014-07-25 DIAGNOSIS — M84375D Stress fracture, left foot, subsequent encounter for fracture with routine healing: Secondary | ICD-10-CM

## 2014-07-25 DIAGNOSIS — Z1231 Encounter for screening mammogram for malignant neoplasm of breast: Secondary | ICD-10-CM | POA: Diagnosis not present

## 2014-07-25 LAB — HM MAMMOGRAPHY

## 2014-07-25 NOTE — Progress Notes (Signed)
   Subjective:    Patient ID: Lacey Burke, female    DOB: [birth year], 69 y.o.   MRN: [id number]  HPI   Patient comes in today for follow-up on a left foot metatarsal stress fracture. She is pain-free. Back to normal activities. Injury is now 72 months old. Previous x-rays showed abundant callus formation around the fracture site. She is here today to discuss custom orthotics. She is currently in green sports insoles with scaphoid pads. She denies pain with her temporary inserts but does seem to notice the scaphoid pad in the arch of her foot. She feels at times like it may be pushing her into supination when she walks. She also states that she rarely wears tennis shoes but she did not bring any other pairs of shoes with her today. She has also recently had a DEXA scan done. She has a history of osteoporosis.     Review of Systems     Objective:   Physical Exam Well-developed, well-nourished. No acute distress. Awake alert and oriented 3. Vital signs reviewed.  Left foot: No tenderness to palpation of the fracture site. Evaluation of her walking gait with her orthotics in shows a neutral gait. No pronation or supination appreciated. Walking without a limp.       Assessment & Plan:  4 months status post healed metatarsal stress fracture, left foot Osteoporosis  I discussed custom orthotics with the patient but I am a little leery of making her some. I'm afraid that the pressure in the arch of her foot would be uncomfortable since she is getting some slight pressure now with the scaphoid pads. Instead, I recommended that she return to the office in the next several days with a couple of pairs of shoes that she wears on a frequent basis. Hopefully we can just place some simple scaphoid pads in the shoes. We can also give her [employer] information for her to order directly from them online.  In regards to her osteoporosis she will have a copy of her DEXA scan forwarded to me. She is currently  on calcium and vitamin D and doing some resistance training. She is very concerned about the side effects of bisphosphonates and this is a discussion that she needs to have with her primary care physician. She can continue to increase activity as tolerated and will follow-up with me when necessary.

## 2014-08-12 ENCOUNTER — Encounter: Payer: Self-pay | Admitting: Family Medicine

## 2014-08-23 ENCOUNTER — Encounter: Payer: Self-pay | Admitting: *Deleted

## 2014-09-01 DIAGNOSIS — M810 Age-related osteoporosis without current pathological fracture: Secondary | ICD-10-CM | POA: Diagnosis not present

## 2014-10-14 ENCOUNTER — Other Ambulatory Visit: Payer: Self-pay | Admitting: Family Medicine

## 2014-11-14 ENCOUNTER — Encounter: Payer: Self-pay | Admitting: Family Medicine

## 2014-11-14 ENCOUNTER — Other Ambulatory Visit: Payer: Self-pay | Admitting: Family Medicine

## 2014-11-14 ENCOUNTER — Ambulatory Visit (INDEPENDENT_AMBULATORY_CARE_PROVIDER_SITE_OTHER): Payer: Medicare Other | Admitting: Family Medicine

## 2014-11-14 VITALS — BP 142/80 | HR 73 | Temp 97.9°F | Resp 16 | Ht 60.5 in | Wt 106.8 lb

## 2014-11-14 DIAGNOSIS — M810 Age-related osteoporosis without current pathological fracture: Secondary | ICD-10-CM | POA: Diagnosis not present

## 2014-11-14 DIAGNOSIS — Z0000 Encounter for general adult medical examination without abnormal findings: Secondary | ICD-10-CM

## 2014-11-14 DIAGNOSIS — R739 Hyperglycemia, unspecified: Secondary | ICD-10-CM | POA: Diagnosis not present

## 2014-11-14 DIAGNOSIS — I10 Essential (primary) hypertension: Secondary | ICD-10-CM

## 2014-11-14 DIAGNOSIS — G4700 Insomnia, unspecified: Secondary | ICD-10-CM | POA: Diagnosis not present

## 2014-11-14 DIAGNOSIS — F419 Anxiety disorder, unspecified: Secondary | ICD-10-CM

## 2014-11-14 DIAGNOSIS — J301 Allergic rhinitis due to pollen: Secondary | ICD-10-CM | POA: Diagnosis not present

## 2014-11-14 DIAGNOSIS — G500 Trigeminal neuralgia: Secondary | ICD-10-CM | POA: Diagnosis not present

## 2014-11-14 DIAGNOSIS — R7309 Other abnormal glucose: Secondary | ICD-10-CM | POA: Diagnosis not present

## 2014-11-14 LAB — COMPREHENSIVE METABOLIC PANEL
ALT: 28 U/L (ref 0–35)
AST: 30 U/L (ref 0–37)
Albumin: 4.8 g/dL (ref 3.5–5.2)
Alkaline Phosphatase: 70 U/L (ref 39–117)
BUN: 23 mg/dL (ref 6–23)
CHLORIDE: 104 meq/L (ref 96–112)
CO2: 28 mEq/L (ref 19–32)
Calcium: 10.1 mg/dL (ref 8.4–10.5)
Creat: 0.66 mg/dL (ref 0.50–1.10)
Glucose, Bld: 92 mg/dL (ref 70–99)
Potassium: 4.3 mEq/L (ref 3.5–5.3)
Sodium: 139 mEq/L (ref 135–145)
Total Bilirubin: 0.7 mg/dL (ref 0.2–1.2)
Total Protein: 7.5 g/dL (ref 6.0–8.3)

## 2014-11-14 LAB — POCT URINALYSIS DIPSTICK
Bilirubin, UA: NEGATIVE
Blood, UA: NEGATIVE
Glucose, UA: NEGATIVE
KETONES UA: NEGATIVE
Leukocytes, UA: NEGATIVE
NITRITE UA: NEGATIVE
Protein, UA: NEGATIVE
SPEC GRAV UA: 1.015
Urobilinogen, UA: 0.2
pH, UA: 7

## 2014-11-14 LAB — CBC WITH DIFFERENTIAL/PLATELET
BASOS PCT: 1 % (ref 0–1)
Basophils Absolute: 0.1 10*3/uL (ref 0.0–0.1)
Eosinophils Absolute: 0.3 10*3/uL (ref 0.0–0.7)
Eosinophils Relative: 4 % (ref 0–5)
HEMATOCRIT: 45.4 % (ref 36.0–46.0)
HEMOGLOBIN: 16.2 g/dL — AB (ref 12.0–15.0)
LYMPHS PCT: 27 % (ref 12–46)
Lymphs Abs: 1.8 10*3/uL (ref 0.7–4.0)
MCH: 30.3 pg (ref 26.0–34.0)
MCHC: 35.7 g/dL (ref 30.0–36.0)
MCV: 85 fL (ref 78.0–100.0)
MONO ABS: 0.3 10*3/uL (ref 0.1–1.0)
MPV: 9.3 fL (ref 8.6–12.4)
Monocytes Relative: 5 % (ref 3–12)
Neutro Abs: 4.1 10*3/uL (ref 1.7–7.7)
Neutrophils Relative %: 63 % (ref 43–77)
Platelets: 348 10*3/uL (ref 150–400)
RBC: 5.34 MIL/uL — ABNORMAL HIGH (ref 3.87–5.11)
RDW: 13.6 % (ref 11.5–15.5)
WBC: 6.5 10*3/uL (ref 4.0–10.5)

## 2014-11-14 LAB — HEMOGLOBIN A1C
Hgb A1c MFr Bld: 5.1 % (ref <5.7)
Mean Plasma Glucose: 100 mg/dL (ref <117)

## 2014-11-14 MED ORDER — TRAZODONE HCL 50 MG PO TABS: 25.0000 mg | ORAL_TABLET | Freq: Every evening | ORAL | Status: DC | PRN

## 2014-11-14 MED ORDER — LISINOPRIL 5 MG PO TABS: 5.0000 mg | ORAL_TABLET | Freq: Every day | ORAL | Status: DC

## 2014-11-14 MED ORDER — ZOSTER VACCINE LIVE 19400 UNT/0.65ML ~~LOC~~ SOLR
0.6500 mL | Freq: Once | SUBCUTANEOUS | Status: DC
Start: 2014-11-14 — End: 2015-11-01

## 2014-11-14 NOTE — Progress Notes (Signed)
Subjective:    Patient ID: Lacey Burke, female    DOB: 07/29/1945, 69 y.o.   MRN: [id number]  11/14/2014  Annual Exam; Hypertension; Insomnia; Trigeminal Neuralgia; and Anxiety   HPI This 69 y.o. female presents for Annual Wellness Examination.  Last physical:  09-06-2013 Pap smear:  01-2014; [name] Mammogram:  [id number] Colonoscopy:  01-04-2013 Collene Mares. Repeat in 10 years. Bone density:  07-2014 TDAP:  2009 Pneumovax: never; has refused in the past.  Zostavax: has not received. Influenza:  06-13-2014 Eye exam:  Cataract surgery last summer June and July 2015.  Reading glasses only. Dental exam:  Refuses after trigeminal neuralgia.    Hypertension: Patient reports good compliance with medication, good tolerance to medication, and good symptom control.  Weaned Metoprolol at last visit; started HCTZ daily.  Home readings running 130s/80s.  Heart rate of 60s at home.    Trigeminal Neuralgia: has made appointment with neurologist; feels great; had a few pains in past few weeks.    Osteoporosis:  Got new running shoes with new inserts.   Stress fracture.  Has been refusing Fosamax for years.  Repeat bone density was worse; highly recommended started Fosamax.    Anxiety:  Weaned off of Citalopram after last visit.  Now suffering with insomnia.  Fallling alseep well but will awaken at night; then cannot fall back asleep.  Would like to sleep throughout the night.  Melatonin; benadryl Unisom without improvement.  Does not want to restart Citalopram.  Feels like Citalopram may have caused weight gain.  Has taken Xanax.     Review of Systems  Constitutional: Negative for fever, chills, diaphoresis, activity change, appetite change, fatigue and unexpected weight change.  HENT: Negative for congestion, dental problem, drooling, ear discharge, ear pain, facial swelling, hearing loss, mouth sores, nosebleeds, postnasal drip, rhinorrhea, sinus pressure, sneezing, sore throat, tinnitus, trouble  swallowing and voice change.   Eyes: Negative for photophobia, pain, discharge, redness, itching and visual disturbance.  Respiratory: Negative for apnea, cough, choking, chest tightness, shortness of breath, wheezing and stridor.   Cardiovascular: Negative for chest pain, palpitations and leg swelling.  Gastrointestinal: Negative for nausea, vomiting, abdominal pain, diarrhea, constipation, blood in stool, abdominal distention, anal bleeding and rectal pain.  Endocrine: Negative for cold intolerance, heat intolerance, polydipsia, polyphagia and polyuria.  Genitourinary: Negative for dysuria, urgency, frequency, hematuria, flank pain, decreased urine volume, vaginal bleeding, vaginal discharge, enuresis, difficulty urinating, genital sores, vaginal pain, menstrual problem, pelvic pain and dyspareunia.  Musculoskeletal: Negative for myalgias, back pain, joint swelling, arthralgias, gait problem, neck pain and neck stiffness.  Skin: Negative for color change, pallor, rash and wound.  Allergic/Immunologic: Positive for environmental allergies. Negative for food allergies and immunocompromised state.  Neurological: Negative for dizziness, tremors, seizures, syncope, facial asymmetry, speech difficulty, weakness, light-headedness, numbness and headaches.  Hematological: Negative for adenopathy. Does not bruise/bleed easily.  Psychiatric/Behavioral: Positive for sleep disturbance. Negative for suicidal ideas, hallucinations, behavioral problems, confusion, self-injury, dysphoric mood, decreased concentration and agitation. The patient is not nervous/anxious and is not hyperactive.     Past Medical History  Diagnosis Date  . Osteoporosis   . Allergy     Zyrtec daily.  . Anxiety   . Blood transfusion without reported diagnosis 07/15/1980    SAB at second trimester with IUD; required transfusion.  Marland Kitchen Heart murmur     childhood onset.  Marland Kitchen Hypertension 07/15/1998  . Cataract   . GERD (gastroesophageal  reflux disease)   . Trigeminal neuralgia 07/15/1996  s/p surgical procedure 05/15/2013 [facility].   Past Surgical History  Procedure Laterality Date  . Tonsilectomy, adenoidectomy, bilateral myringotomy and tubes    . Cosmetic surgery    . Breast surgery      Augmentation in 1970's/Implant removal 1990's  . Nose surgery  07/15/1978  . Abdominal hysterectomy  2007    Fibroids; ovaries resected.  . Hand surgery Left august 2014  . Surgical procedure for trigeminal neuralgia  05/15/2013    [facility]  . Colonoscopy  02/08/2013    normal.  Repeat in 10 years.  [name].  . Brain surgery  05/15/2013    surgical procedure for trigeminal neuralgia. [place]  . Eye surgery     Allergies  Allergen Reactions  . Mobic [Meloxicam]   . Bee Venom Rash    ON LEGS   Current Outpatient Prescriptions  Medication Sig Dispense Refill  . alendronate (FOSAMAX) 70 MG tablet Take 70 mg by mouth once a week. Take with a full glass of water on an empty stomach.    Marland Kitchen aspirin 81 MG tablet Take 81 mg by mouth daily.    Marland Kitchen BIOTIN PO Take by mouth.    . calcium carbonate (OS-CAL) 600 MG TABS tablet Take 600 mg by mouth.    . Calcium Carbonate-Vit D-Min (CALCIUM 600+D PLUS MINERALS) 600-400 MG-UNIT TABS Take by mouth.    . cetirizine (ZYRTEC) 10 MG tablet Take 10 mg by mouth.    . Cetirizine HCl 10 MG CAPS Take by mouth.    . co-enzyme Q-10 30 MG capsule Take 30 mg by mouth.    Marland Kitchen Co-Enzyme Q-10 60 MG CAPS Take by mouth.    . Coenzyme Q10 (CO Q 10 PO) Take 1 tablet by mouth daily.    Marland Kitchen EPINEPHrine (EPIPEN 2-PAK) 0.3 mg/0.3 mL SOAJ injection Inject 0.3 mLs (0.3 mg total) into the muscle once. 2 Device 1  . EPINEPHrine 0.3 mg/0.3 mL IJ SOAJ injection Inject into the muscle.    . Garlic 702 MG CAPS Take 580 mg by mouth daily.     . Glucosamine HCl (SM GLUCOSAMINE HCL) 1500 MG TABS Take by mouth.    . Glucosamine HCl 1500 MG TABS Take by mouth. 1500mg  glucosamine, 1500 chondroitin, with MSM    . glucosamine-chondroitin 500-400 MG  tablet Take 1 tablet by mouth.    . hydrochlorothiazide (HYDRODIURIL) 12.5 MG tablet Take 1 tablet (12.5 mg total) by mouth daily. 30 tablet 5  . hydrochlorothiazide (MICROZIDE) 12.5 MG capsule Take 1 capsule (12.5 mg total) by mouth daily. PATIENT NEEDS OFFICE VISIT FOR ADDITIONAL REFILLS 30 capsule 0  . Influenza Vac Types A & B PF (FLUVIRIN PRESERVATIVE FREE) 0.5 ML SUSY     . magnesium gluconate (MAGONATE) 500 MG tablet Take 500 mg by mouth.    . magnesium oxide (MAG-OX) 400 MG tablet Take 200 mg by mouth daily.     . magnesium oxide (MAG-OX) 400 MG tablet Take by mouth.    Marland Kitchen MINOXIDIL FOR WOMEN EX Apply topically.    . polyethylene glycol (MIRALAX / GLYCOLAX) packet Take 17 g by mouth daily.    . polyethylene glycol (MIRALAX / GLYCOLAX) packet Take 17 g by mouth.    . Probiotic Product (PROBIOTIC DAILY PO) Take 1 tablet by mouth daily.    . Turmeric Curcumin 500 MG CAPS Take 500 mg by mouth.    . Vitamin D, Cholecalciferol, 1000 UNITS TABS Take 1,000 Units by mouth daily.    Marland Kitchen lisinopril (  PRINIVIL,ZESTRIL) 5 MG tablet Take 1 tablet (5 mg total) by mouth daily. 90 tablet 3  . traZODone (DESYREL) 50 MG tablet Take 0.5-2 tablets (25-100 mg total) by mouth at bedtime as needed for sleep. 60 tablet 5  . zoster vaccine live, PF, (ZOSTAVAX) 40981 UNT/0.65ML injection Inject 19,400 Units into the skin once. 0.65 mL 0   No current facility-administered medications for this visit.   History   Social History  . Marital Status: Single    Spouse Name: N/A  . Number of Children: 2  . Years of Education: N/A   Occupational History  . retired [employer]  . 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 Smoking status: Former Research scientist (life sciences)  . Smokeless tobacco: Never Used  . Alcohol Use: 1.8 - 2.4 oz/week    3-4 Glasses of wine per week     Comment: socailly wine  . Drug Use: No  . Sexual Activity: No   Other Topics Concern  . Not on file    Social History Narrative   Marital: divorced since 1994 after 20 years of marriage; +dating since 04/2014.       Children: 2 children/sons (38, 30); no grandchildren.  One on the way in 11/2014.      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      Tobacco:  Former smoker; quit in college.      Alcohol:  Socially wine 3 nights per week.      Drugs: none      Exercise:  Three days per week; walking/running/weights. Computer programs stengthening.      ADLs: independent with all ADLs; drives.      Family History  Problem Relation Age of Onset  . Obesity Mother   . Diabetes Mother   . Hypertension Mother   . Hyperlipidemia Mother   . Dementia Mother   . Diabetes Father   . Arthritis Father     Spinal stenosis  . Irritable bowel syndrome Son   . GER disease Son   . Mental illness Sister     anxiety  . Mental illness Sister     anxiety  . Arthritis Sister 50    spinal stenosis  . Hypertension Brother   . Hypertension Maternal Grandfather        Objective:    BP 142/80 mmHg  Pulse 73  Temp(Src) 97.9 F (36.6 C) (Oral)  Resp 16  Ht 5' 0.5" (1.537 m)  Wt 106 lb 12.8 oz (48.444 kg)  BMI 20.51 kg/m2  SpO2 100% Physical Exam  Constitutional: She is oriented to person, place, and time. She appears well-developed and well-nourished. No distress.  HENT:  Head: Normocephalic and atraumatic.  Right Ear: External ear normal.  Left Ear: External ear normal.  Nose: Nose normal.  Mouth/Throat: Oropharynx is clear and moist.  Eyes: Conjunctivae and EOM are normal. Pupils are equal, round, and reactive to light.  Neck: Normal range of motion and full passive range of motion without pain. Neck supple. No JVD present. Carotid bruit is not present. No thyromegaly present.  Cardiovascular: Normal rate, regular rhythm and normal heart sounds.   Exam reveals no gallop and no friction rub.   No murmur heard. Pulmonary/Chest: Effort normal and  breath sounds normal. She has no wheezes. She has no rales.  Abdominal: Soft. Bowel sounds are normal. She exhibits no distension and no mass. There is no tenderness. There is no rebound and no guarding.  Musculoskeletal:       Right shoulder: Normal.       Left shoulder: Normal.       Cervical back: Normal.  Lymphadenopathy:    She has no cervical adenopathy.  Neurological: She is alert and oriented to person, place, and time. She has normal reflexes. No cranial nerve deficit. She exhibits normal muscle tone. Coordination normal.  Skin: Skin is warm and dry. No rash noted. She is not diaphoretic. No erythema. No pallor.  Psychiatric: She has a normal mood and affect. Her behavior is normal. Judgment and thought content normal.  Nursing note and vitals reviewed.  Results for orders placed or performed in visit on 11/14/14  POCT urinalysis dipstick  Result Value Ref Range   Color, UA yellow    Clarity, UA clear    Glucose, UA neg    Bilirubin, UA neg    Ketones, UA neg    Spec Grav, UA 1.015    Blood, UA neg    pH, UA 7.0    Protein, UA neg    Urobilinogen, UA 0.2    Nitrite, UA neg    Leukocytes, UA Negative        Assessment & Plan:   1. Routine physical examination   2. Trigeminal neuralgia   3. Osteoporosis   4. Essential hypertension   5. Anxiety   6. Allergic rhinitis due to pollen   7. Hyperglycemia   8. Insomnia     1. Annual Wellness Examination: anticipatory guidance --- exercise, weight maintenance, 3 servings of dairy daily.  Pap smear and mammogram per gynecology and UTD.  Bone density UTD.  Colonoscopy UTD.  Immunizations reviewed; pt declined Prevnar-13 again this year; rx for Zostavax provided again.  Independent with ADLs.  Moderate fall risk due to history of fall in past year.  No evidence of depression or anxiety.  No hearing loss.   2.  HTN: moderately  controlled; continue HCTZ 12.5mg  daily; add Lisinopril 5mg  daily; obtain labs.  RTC three months. 3. Insomnia:  New.  Rx for Trazodone provided.  Sleep hygiene reviewed. 4.  Anxiety: improved/resolved off of Citalopram. 5.  Hyperglycemia: new at CPE last year; repeat glucose and HgbA1c today. 6.  Osteoporosis: persistent; suffered stress fracture this spring; tolerating Fosamax; continue with 3 servings of dairy daily; daily weight bearing exercise also recommended. 7.  Allergic Rhinitis: stable on Zyrtec.    Meds ordered this encounter  Medications  . alendronate (FOSAMAX) 70 MG tablet    Sig: Take 70 mg by mouth once a week. Take with a full glass of water on an empty stomach.  Marland Kitchen aspirin 81 MG tablet    Sig: Take 81 mg by mouth daily.  Marland Kitchen BIOTIN PO    Sig: Take by mouth.  Marland Kitchen MINOXIDIL FOR WOMEN EX    Sig: Apply topically.  Marland Kitchen lisinopril (PRINIVIL,ZESTRIL) 5 MG tablet    Sig: Take 1 tablet (5 mg total) by mouth daily.    Dispense:  90 tablet    Refill:  3  . traZODone (DESYREL) 50 MG tablet    Sig: Take 0.5-2 tablets (25-100 mg total) by mouth at bedtime as needed for sleep.    Dispense:  60 tablet    Refill:  5  . zoster  vaccine live, PF, (ZOSTAVAX) 17616 UNT/0.65ML injection    Sig: Inject 19,400 Units into the skin once.    Dispense:  0.65 mL    Refill:  0    Return in about 3 months (around 02/14/2015) for recheck high blood pressure, insomnia.     Coalton Arch Elayne Guerin, M.D. [facility] [address] [place], [place]  [phone] phone [phone] fax

## 2014-11-14 NOTE — Patient Instructions (Signed)

## 2014-11-14 NOTE — Progress Notes (Signed)
   Subjective:    Patient ID: Lacey Burke, female    DOB: 11/27/1945, 69 y.o.   MRN: [id number]  HPI    Review of Systems  Constitutional: Negative.   HENT: Negative.   Eyes: Negative.   Respiratory: Negative.   Cardiovascular: Negative.   Gastrointestinal: Negative.   Endocrine: Negative.   Genitourinary: Negative.   Musculoskeletal: Negative.   Skin: Negative.   Allergic/Immunologic: Positive for environmental allergies.  Neurological: Negative.   Hematological: Negative.   Psychiatric/Behavioral: Positive for sleep disturbance.       Objective:   Physical Exam        Assessment & Plan:

## 2014-11-24 DIAGNOSIS — H11442 Conjunctival cysts, left eye: Secondary | ICD-10-CM | POA: Diagnosis not present

## 2014-12-01 DIAGNOSIS — G4710 Hypersomnia, unspecified: Secondary | ICD-10-CM | POA: Diagnosis not present

## 2014-12-01 DIAGNOSIS — G500 Trigeminal neuralgia: Secondary | ICD-10-CM | POA: Diagnosis not present

## 2015-02-13 ENCOUNTER — Ambulatory Visit (INDEPENDENT_AMBULATORY_CARE_PROVIDER_SITE_OTHER): Payer: Medicare Other | Admitting: Family Medicine

## 2015-02-13 ENCOUNTER — Encounter: Payer: Self-pay | Admitting: Family Medicine

## 2015-02-13 VITALS — BP 131/78 | HR 60 | Temp 97.7°F | Resp 16 | Wt 105.8 lb

## 2015-02-13 DIAGNOSIS — R05 Cough: Secondary | ICD-10-CM

## 2015-02-13 DIAGNOSIS — R059 Cough, unspecified: Secondary | ICD-10-CM

## 2015-02-13 DIAGNOSIS — G4700 Insomnia, unspecified: Secondary | ICD-10-CM

## 2015-02-13 DIAGNOSIS — I10 Essential (primary) hypertension: Secondary | ICD-10-CM | POA: Diagnosis not present

## 2015-02-13 LAB — CBC WITH DIFFERENTIAL/PLATELET
Basophils Absolute: 0.1 10*3/uL (ref 0.0–0.1)
Basophils Relative: 1 % (ref 0–1)
Eosinophils Absolute: 0.1 10*3/uL (ref 0.0–0.7)
Eosinophils Relative: 2 % (ref 0–5)
HCT: 41.2 % (ref 36.0–46.0)
Hemoglobin: 14.8 g/dL (ref 12.0–15.0)
Lymphocytes Relative: 26 % (ref 12–46)
Lymphs Abs: 1.5 10*3/uL (ref 0.7–4.0)
MCH: 31.4 pg (ref 26.0–34.0)
MCHC: 35.9 g/dL (ref 30.0–36.0)
MCV: 87.3 fL (ref 78.0–100.0)
MPV: 9.6 fL (ref 8.6–12.4)
Monocytes Absolute: 0.4 10*3/uL (ref 0.1–1.0)
Monocytes Relative: 7 % (ref 3–12)
Neutro Abs: 3.8 10*3/uL (ref 1.7–7.7)
Neutrophils Relative %: 64 % (ref 43–77)
Platelets: 301 10*3/uL (ref 150–400)
RBC: 4.72 MIL/uL (ref 3.87–5.11)
RDW: 14 % (ref 11.5–15.5)
WBC: 5.9 10*3/uL (ref 4.0–10.5)

## 2015-02-13 LAB — COMPREHENSIVE METABOLIC PANEL
ALK PHOS: 49 U/L (ref 33–130)
ALT: 29 U/L (ref 6–29)
AST: 28 U/L (ref 10–35)
Albumin: 4.3 g/dL (ref 3.6–5.1)
BILIRUBIN TOTAL: 0.6 mg/dL (ref 0.2–1.2)
BUN: 19 mg/dL (ref 7–25)
CO2: 27 mmol/L (ref 20–31)
Calcium: 9.7 mg/dL (ref 8.6–10.4)
Chloride: 105 mmol/L (ref 98–110)
Creat: 0.61 mg/dL (ref 0.50–0.99)
Glucose, Bld: 93 mg/dL (ref 65–99)
Potassium: 5.4 mmol/L — ABNORMAL HIGH (ref 3.5–5.3)
Sodium: 141 mmol/L (ref 135–146)
TOTAL PROTEIN: 6.5 g/dL (ref 6.1–8.1)

## 2015-02-13 MED ORDER — LOSARTAN POTASSIUM 25 MG PO TABS: 25.0000 mg | ORAL_TABLET | Freq: Every day | ORAL | Status: DC

## 2015-02-13 MED ORDER — TRAZODONE HCL 50 MG PO TABS: 50.0000 mg | ORAL_TABLET | Freq: Every evening | ORAL | Status: DC | PRN

## 2015-02-13 NOTE — Progress Notes (Signed)
Subjective:    Patient ID: Lacey Burke, female    DOB: Jan 16, 1946, 69 y.o.   MRN: [id number]  02/13/2015  Hypertension and Insomnia   HPI This 69 y.o. female presents for three month follow-up:  1. HTN: added Lisinopril 25m daily at last visit. Blood pressure at home doing well; running 120s/70s.  No side effects to Lisinopril other than intermittent dry cough.  Denies CP/palp/SOB/leg swelling. Denies HA/dizziness/focal weakness/paresthesias.  2.  Insomnia: added Trazodone 59mqhs.Increased to two tablets nightly without improvement in sleep so decreased back down to 1 tablet qhs. Sleeping 6 hours per night on average. Does get sleepy watching television each night.  Emotionally doing very well.    Review of Systems  Constitutional: Negative for fever, chills, diaphoresis and fatigue.  Eyes: Negative for visual disturbance.  Respiratory: Positive for cough. Negative for shortness of breath.   Cardiovascular: Negative for chest pain, palpitations and leg swelling.  Gastrointestinal: Negative for nausea, vomiting, abdominal pain, diarrhea and constipation.  Endocrine: Negative for cold intolerance, heat intolerance, polydipsia, polyphagia and polyuria.  Neurological: Negative for dizziness, tremors, seizures, syncope, facial asymmetry, speech difficulty, weakness, light-headedness, numbness and headaches.  Psychiatric/Behavioral: Positive for sleep disturbance. Negative for suicidal ideas, self-injury and dysphoric mood. The patient is not nervous/anxious.     Past Medical History  Diagnosis Date  . Osteoporosis   . Allergy     Zyrtec daily.  . Anxiety   . Blood transfusion without reported diagnosis 07/15/1980    SAB at second trimester with IUD; required transfusion.  . Marland Kitcheneart murmur     childhood onset.  . Marland Kitchenypertension 07/15/1998  . Cataract   . GERD (gastroesophageal reflux disease)   . Trigeminal neuralgia 07/15/1996    s/p surgical procedure 05/15/2013 [facility].   Past Surgical  History  Procedure Laterality Date  . Tonsilectomy, adenoidectomy, bilateral myringotomy and tubes    . Cosmetic surgery    . Breast surgery      Augmentation in 1970's/Implant removal 1990's  . Nose surgery  07/15/1978  . Abdominal hysterectomy  2007    Fibroids; ovaries resected.  . Hand surgery Left august 2014  . Surgical procedure for trigeminal neuralgia  05/15/2013    [facility]  . Colonoscopy  02/08/2013    normal.  Repeat in 10 years.  Nat MaCollene Mares . Brain surgery  05/15/2013    [facility]gia[facility]. Eye [facility]     Allergies  Allergen Reactions  . Mobic [Meloxicam]   . Bee Venom Rash    ON LEGS   History   Social History  . Marital Status: Single    Spouse Name: N/A  . Number of Children: 2  . Years of Education: N/A   Occupational History  . retired [employer]. 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 Smoking status: Former SmResearch scientist (life sciences). Smokeless tobacco: Never Used  . Alcohol Use: 1.8 - 2.4 oz/week    3-4 Glasses of wine per week     Comment: socailly wine  . Drug Use: No  . Sexual Activity: No   Other Topics Concern  . Not on file   Social History Narrative   Marital: divorced since 1994 after 20 years of marriage; +dating since 04/2014.       Children: 2 children/sons (38, 30); no grandchildren.  One on the way in 11/2014.  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      Tobacco:  Former smoker; quit in college.      Alcohol:  Socially wine 3 nights per week.      Drugs: none      Exercise:  Three days per week; walking/running/weights. Computer programs stengthening.      ADLs: independent with all ADLs; drives.           Objective:    BP 131/78 mmHg  Pulse 60  Temp(Src) 97.7 F (36.5 C) (Oral)  Resp 16  Wt 105  lb 12.8 oz (47.991 kg) Physical Exam  Constitutional: She is oriented to person, place, and time. She appears well-developed and well-nourished. No distress.  HENT:  Head: Normocephalic and atraumatic.  Right Ear: External ear normal.  Left Ear: External ear normal.  Nose: Nose normal.  Mouth/Throat: Oropharynx is clear and moist.  Eyes: Conjunctivae and EOM are normal. Pupils are equal, round, and reactive to light.  Neck: Normal range of motion. Neck supple. Carotid bruit is not present. No thyromegaly present.  Cardiovascular: Normal rate, regular rhythm, normal heart sounds and intact distal pulses.  Exam reveals no gallop and no friction rub.   No murmur heard. Pulmonary/Chest: Effort normal and breath sounds normal. She has no wheezes. She has no rales.  Lymphadenopathy:    She has no cervical adenopathy.  Neurological: She is alert and oriented to person, place, and time. No cranial nerve deficit.  Skin: Skin is warm and dry. No rash noted. She is not diaphoretic. No erythema. No pallor.  Psychiatric: She has a normal mood and affect. Her behavior is normal.   Results for orders placed or performed in visit on 11/14/14  CBC with Differential/Platelet  Result Value Ref Range   WBC 6.5 4.0 - 10.5 K/uL   RBC 5.34 (H) 3.87 - 5.11 MIL/uL   Hemoglobin 16.2 (H) 12.0 - 15.0 g/dL   HCT 45.4 36.0 - 46.0 %   MCV 85.0 78.0 - 100.0 fL   MCH 30.3 26.0 - 34.0 pg   MCHC 35.7 30.0 - 36.0 g/dL   RDW 13.6 11.5 - 15.5 %   Platelets 348 150 - 400 K/uL   MPV 9.3 8.6 - 12.4 fL   Neutrophils Relative % 63 43 - 77 %   Neutro Abs 4.1 1.7 - 7.7 K/uL   Lymphocytes Relative 27 12 - 46 %   Lymphs Abs 1.8 0.7 - 4.0 K/uL   Monocytes Relative 5 3 - 12 %   Monocytes Absolute 0.3 0.1 - 1.0 K/uL   Eosinophils Relative 4 0 - 5 %   Eosinophils Absolute 0.3 0.0 - 0.7 K/uL   Basophils Relative 1 0 - 1 %   Basophils Absolute 0.1 0.0 - 0.1 K/uL   Smear Review Criteria for review not met   Comprehensive  metabolic panel  Result Value Ref Range   Sodium 139 135 - 145 mEq/L   Potassium 4.3 3.5 - 5.3 mEq/L   Chloride 104 96 - 112 mEq/L   CO2 28 19 - 32 mEq/L   Glucose, Bld 92 70 - 99 mg/dL   BUN 23 6 - 23 mg/dL   Creat 0.66 0.50 - 1.10 mg/dL   Total Bilirubin 0.7 0.2 - 1.2 mg/dL   Alkaline Phosphatase 70 39 - 117  U/L   AST 30 0 - 37 U/L   ALT 28 0 - 35 U/L   Total Protein 7.5 6.0 - 8.3 g/dL   Albumin 4.8 3.5 - 5.2 g/dL   Calcium 10.1 8.4 - 10.5 mg/dL  Hemoglobin A1c  Result Value Ref Range   Hgb A1c MFr Bld 5.1 <5.7 %   Mean Plasma Glucose 100 <117 mg/dL  POCT urinalysis dipstick  Result Value Ref Range   Color, UA yellow    Clarity, UA clear    Glucose, UA neg    Bilirubin, UA neg    Ketones, UA neg    Spec Grav, UA 1.015    Blood, UA neg    pH, UA 7.0    Protein, UA neg    Urobilinogen, UA 0.2    Nitrite, UA neg    Leukocytes, UA Negative        Assessment & Plan:   1. Essential hypertension   2. Insomnia   3. Cough     1. HTN: controlled; will switch Lisinopril to Losartan due to cough.  Obtain labs. RTC three months for BP check on Losartan. 2.  Insomnia: improved but persistent; tolerating Trazodone without side effects; increase Trazodone to 17m qhs.  RTC three months; would like to improve sleep to 7-9 hours per night.  Emotionally doing well. 3.  Cough: New. Likely secondary to Lisinopril; switch Lisinopril to Losartan.   Meds ordered this encounter  Medications  . traZODone (DESYREL) 50 MG tablet    Sig: Take 1-3 tablets (50-150 mg total) by mouth at bedtime as needed for sleep.    Dispense:  90 tablet    Refill:  5  . losartan (COZAAR) 25 MG tablet    Sig: Take 1 tablet (25 mg total) by mouth daily.    Dispense:  90 tablet    Refill:  1    Return in about 3 months (around 05/16/2015) for recheck.    Omega Durante MElayne Guerin M.D. [facility]  [phone]

## 2015-02-13 NOTE — Patient Instructions (Signed)
Insomnia Insomnia is frequent trouble falling and/or staying asleep. Insomnia can be a long term problem or a short term problem. Both are common. Insomnia can be a short term problem when the wakefulness is related to a certain stress or worry. Long term insomnia is often related to ongoing stress during waking hours and/or poor sleeping habits. Overtime, sleep deprivation itself can make the problem worse. Every little thing feels more severe because you are overtired and your ability to cope is decreased. CAUSES   Stress, anxiety, and depression.  Poor sleeping habits.  Distractions such as TV in the bedroom.  Naps close to bedtime.  Engaging in emotionally charged conversations before bed.  Technical reading before sleep.  Alcohol and other sedatives. They may make the problem worse. They can hurt normal sleep patterns and normal dream activity.  Stimulants such as caffeine for several hours prior to bedtime.  Pain syndromes and shortness of breath can cause insomnia.  Exercise late at night.  Changing time zones may cause sleeping problems (jet lag). It is sometimes helpful to have someone observe your sleeping patterns. They should look for periods of not breathing during the night (sleep apnea). They should also look to see how long those periods last. If you live alone or observers are uncertain, you can also be observed at a sleep clinic where your sleep patterns will be professionally monitored. Sleep apnea requires a checkup and treatment. Give your caregivers your medical history. Give your caregivers observations your family has made about your sleep.  SYMPTOMS   Not feeling rested in the morning.  Anxiety and restlessness at bedtime.  Difficulty falling and staying asleep. TREATMENT   Your caregiver may prescribe treatment for an underlying medical disorders. Your caregiver can give advice or help if you are using alcohol or other drugs for self-medication. Treatment  of underlying problems will usually eliminate insomnia problems.  Medications can be prescribed for short time use. They are generally not recommended for lengthy use.  Over-the-counter sleep medicines are not recommended for lengthy use. They can be habit forming.  You can promote easier sleeping by making lifestyle changes such as:  Using relaxation techniques that help with breathing and reduce muscle tension.  Exercising earlier in the day.  Changing your diet and the time of your last meal. No night time snacks.  Establish a regular time to go to bed.  Counseling can help with stressful problems and worry.  Soothing music and white noise may be helpful if there are background noises you cannot remove.  Stop tedious detailed work at least one hour before bedtime. HOME CARE INSTRUCTIONS   Keep a diary. Inform your caregiver about your progress. This includes any medication side effects. See your caregiver regularly. Take note of:  Times when you are asleep.  Times when you are awake during the night.  The quality of your sleep.  How you feel the next day. This information will help your caregiver care for you.  Get out of bed if you are still awake after 15 minutes. Read or do some quiet activity. Keep the lights down. Wait until you feel sleepy and go back to bed.  Keep regular sleeping and waking hours. Avoid naps.  Exercise regularly.  Avoid distractions at bedtime. Distractions include watching television or engaging in any intense or detailed activity like attempting to balance the household checkbook.  Develop a bedtime ritual. Keep a familiar routine of bathing, brushing your teeth, climbing into bed at the same   time each night, listening to soothing music. Routines increase the success of falling to sleep faster.  Use relaxation techniques. This can be using breathing and muscle tension release routines. It can also include visualizing peaceful scenes. You can  also help control troubling or intruding thoughts by keeping your mind occupied with boring or repetitive thoughts like the old concept of counting sheep. You can make it more creative like imagining planting one beautiful flower after another in your backyard garden.  During your day, work to eliminate stress. When this is not possible use some of the previous suggestions to help reduce the anxiety that accompanies stressful situations. MAKE SURE YOU:   Understand these instructions.  Will watch your condition.  Will get help right away if you are not doing well or get worse. Document Released: 06/28/2000 Document Revised: 09/23/2011 Document Reviewed: 07/29/2007 ExitCare Patient Information 2015 [employer]. This information is not intended to replace advice given to you by your health care provider. Make sure you discuss any questions you have with your health care provider.  

## 2015-02-28 DIAGNOSIS — M810 Age-related osteoporosis without current pathological fracture: Secondary | ICD-10-CM | POA: Diagnosis not present

## 2015-02-28 DIAGNOSIS — Z01419 Encounter for gynecological examination (general) (routine) without abnormal findings: Secondary | ICD-10-CM | POA: Diagnosis not present

## 2015-02-28 DIAGNOSIS — R87610 Atypical squamous cells of undetermined significance on cytologic smear of cervix (ASC-US): Secondary | ICD-10-CM | POA: Diagnosis not present

## 2015-02-28 DIAGNOSIS — Z124 Encounter for screening for malignant neoplasm of cervix: Secondary | ICD-10-CM | POA: Diagnosis not present

## 2015-03-14 ENCOUNTER — Other Ambulatory Visit: Payer: Self-pay | Admitting: Physician Assistant

## 2015-04-03 DIAGNOSIS — Z23 Encounter for immunization: Secondary | ICD-10-CM | POA: Diagnosis not present

## 2015-04-07 ENCOUNTER — Encounter: Payer: Self-pay | Admitting: Family Medicine

## 2015-04-10 ENCOUNTER — Other Ambulatory Visit: Payer: Self-pay | Admitting: Physician Assistant

## 2015-04-13 ENCOUNTER — Encounter: Payer: Self-pay | Admitting: Family Medicine

## 2015-04-13 ENCOUNTER — Telehealth: Payer: Self-pay | Admitting: Family Medicine

## 2015-04-13 NOTE — Telephone Encounter (Signed)
Patient states that she needs a generic Epi Pen (does not want it to come from [employer]). She is leaving to go to [place] and [place] on Sunday and will be gone for 2 weeks.  [phone]

## 2015-04-13 NOTE — Telephone Encounter (Signed)
You can refill, however there is only one brand of epi-pen.

## 2015-04-13 NOTE — Telephone Encounter (Signed)
Can we refill? 

## 2015-04-14 MED ORDER — EPINEPHRINE 0.3 MG/0.3ML IJ SOAJ: 0.3000 mg | Freq: Once | INTRAMUSCULAR | Status: DC

## 2015-04-14 NOTE — Addendum Note (Signed)
Addended by: Jannette Spanner on: 04/14/2015 05:21 PM   Modules accepted: Orders

## 2015-04-14 NOTE — Telephone Encounter (Signed)
Rx sent 

## 2015-05-05 ENCOUNTER — Encounter: Payer: Self-pay | Admitting: Family Medicine

## 2015-05-16 ENCOUNTER — Other Ambulatory Visit: Payer: Self-pay | Admitting: Physician Assistant

## 2015-05-16 DIAGNOSIS — B081 Molluscum contagiosum: Secondary | ICD-10-CM | POA: Diagnosis not present

## 2015-05-16 DIAGNOSIS — D2361 Other benign neoplasm of skin of right upper limb, including shoulder: Secondary | ICD-10-CM | POA: Diagnosis not present

## 2015-05-16 DIAGNOSIS — D485 Neoplasm of uncertain behavior of skin: Secondary | ICD-10-CM | POA: Diagnosis not present

## 2015-05-22 ENCOUNTER — Ambulatory Visit (INDEPENDENT_AMBULATORY_CARE_PROVIDER_SITE_OTHER): Payer: Medicare Other | Admitting: Family Medicine

## 2015-05-22 ENCOUNTER — Encounter: Payer: Self-pay | Admitting: Family Medicine

## 2015-05-22 VITALS — BP 121/79 | HR 69 | Temp 98.6°F | Resp 16 | Ht 60.5 in | Wt 105.2 lb

## 2015-05-22 DIAGNOSIS — I10 Essential (primary) hypertension: Secondary | ICD-10-CM

## 2015-05-22 DIAGNOSIS — R059 Cough, unspecified: Secondary | ICD-10-CM

## 2015-05-22 DIAGNOSIS — G4700 Insomnia, unspecified: Secondary | ICD-10-CM | POA: Diagnosis not present

## 2015-05-22 DIAGNOSIS — R05 Cough: Secondary | ICD-10-CM

## 2015-05-22 NOTE — Progress Notes (Signed)
Subjective:    Patient ID: Lacey Burke, female    DOB: 1946/02/11, 69 y.o.   MRN: [id number]  05/22/2015  Follow-up   HPI This 69 y.o. female presents for three month follow-up:   1.  HTN:  Switched to Losartan from Lisinopril.  Cough resolved.  2.  Insomnia: increased to Trazodone to two qhs. Sleeping better.  3.  Cough: resolved with cessation of Lisinopril.    4. S/p flu vaccine. Reluctant to undergo Pneumovax or Prevnar 13.     Review of Systems  Constitutional: Negative for fever, chills, diaphoresis and fatigue.  Eyes: Negative for visual disturbance.  Respiratory: Negative for cough and shortness of breath.   Cardiovascular: Negative for chest pain, palpitations and leg swelling.  Gastrointestinal: Negative for nausea, vomiting, abdominal pain, diarrhea and constipation.  Endocrine: Negative for cold intolerance, heat intolerance, polydipsia, polyphagia and polyuria.  Neurological: Negative for dizziness, tremors, seizures, syncope, facial asymmetry, speech difficulty, weakness, light-headedness, numbness and headaches.    Past Medical History  Diagnosis Date  . Osteoporosis   . Allergy     Zyrtec daily.  . Anxiety   . Blood transfusion without reported diagnosis 07/15/1980    SAB at second trimester with IUD; required transfusion.  Marland Kitchen Heart murmur     childhood onset.  Marland Kitchen Hypertension 07/15/1998  . Cataract   . GERD (gastroesophageal reflux disease)   . Trigeminal neuralgia 07/15/1996    s/p surgical procedure 05/15/2013 [facility].   Past Surgical History  Procedure Laterality Date  . Tonsilectomy, adenoidectomy, bilateral myringotomy and tubes    . Cosmetic surgery    . Breast surgery      Augmentation in 1970's/Implant removal 1990's  . Nose surgery  07/15/1978  . Abdominal hysterectomy  2007    Fibroids; ovaries resected.  . Hand surgery Left august 2014  . Surgical procedure for trigeminal neuralgia  05/15/2013    [facility]  . Colonoscopy  02/08/2013    normal.   Repeat in 10 years.  [name].  . Brain surgery  05/15/2013    surgical procedure for trigeminal neuralgia. [place]  . Eye surgery     Allergies  Allergen Reactions  . Mobic [Meloxicam]   . Bee Venom Rash    ON LEGS   Current Outpatient Prescriptions  Medication Sig Dispense Refill  . alendronate (FOSAMAX) 70 MG tablet Take 70 mg by mouth once a week. Take with a full glass of water on an empty stomach.    Marland Kitchen aspirin 81 MG tablet Take 81 mg by mouth daily.    Marland Kitchen BIOTIN PO Take by mouth.    . calcium carbonate (OS-CAL) 600 MG TABS tablet Take 600 mg by mouth.    . Calcium Carbonate-Vit D-Min (CALCIUM 600+D PLUS MINERALS) 600-400 MG-UNIT TABS Take by mouth.    . EPINEPHrine (EPIPEN 2-PAK) 0.3 mg/0.3 mL IJ SOAJ injection Inject 0.3 mLs (0.3 mg total) into the muscle once. 2 Device 1  . Glucosamine HCl 1500 MG TABS Take by mouth. 1500mg  glucosamine, 1500 chondroitin, with MSM    . hydrochlorothiazide (MICROZIDE) 12.5 MG capsule TAKE ONE CAPSULE BY MOUTH ONCE DAILY 30 capsule 1  . losartan (COZAAR) 25 MG tablet Take 1 tablet (25 mg total) by mouth daily. 90 tablet 1  . magnesium gluconate (MAGONATE) 500 MG tablet Take 500 mg by mouth.    . polyethylene glycol (MIRALAX / GLYCOLAX) packet Take 17 g by mouth daily.    . traZODone (DESYREL) 50 MG tablet Take 1-3  tablets (50-150 mg total) by mouth at bedtime as needed for sleep. 90 tablet 5  . Turmeric Curcumin 500 MG CAPS Take 500 mg by mouth.    . Vitamin D, Cholecalciferol, 1000 UNITS TABS Take 1,000 Units by mouth daily.    Marland Kitchen EPINEPHrine 0.3 mg/0.3 mL IJ SOAJ injection Inject into the muscle.    . Influenza Vac Types A & B PF (FLUVIRIN PRESERVATIVE FREE) 0.5 ML SUSY     . zoster vaccine live, PF, (ZOSTAVAX) 24401 UNT/0.65ML injection Inject 19,400 Units into the skin once. 0.65 mL 0   No current facility-administered medications for this visit.   Social History   Social History  . Marital Status: Single    Spouse Name: N/A  . Number of  Children: 2  . Years of Education: N/A   Occupational History  . retired [employer]  . ESL Art therapist  [employer]    part-time in 2016   Social History Main Topics  . Smoking status: Former Research scientist (life sciences)  . Smokeless tobacco: Never Used  . Alcohol Use: 1.8 - 2.4 oz/week    3-4 Glasses of wine per week     Comment: socailly wine  . Drug Use: No  . Sexual Activity: No   Other Topics Concern  . Not on file   Social History Narrative   Marital: divorced since 1994 after 20 years of marriage; +dating since 04/2014.       Children: 2 children/sons (38, 30); no grandchildren.  One on the way in 11/2014.      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      Tobacco:  Former smoker; quit in college.      Alcohol:  Socially wine 3 nights per week.      Drugs: none      Exercise:  Three days per week; walking/running/weights. Computer programs stengthening.      ADLs: independent with all ADLs; drives.      Family History  Problem Relation Age of Onset  . Obesity Mother   . Diabetes Mother   . Hypertension Mother   . Hyperlipidemia Mother   . Dementia Mother   . Diabetes Father   . Arthritis Father     Spinal stenosis  . Irritable bowel syndrome Son   . GER disease Son   . Mental illness Sister     anxiety  . Mental illness Sister     anxiety  . Arthritis Sister 50    spinal stenosis  . Hypertension Brother   . Hypertension Maternal Grandfather        Objective:    BP 121/79 mmHg  Pulse 69  Temp(Src) 98.6 F (37 C) (Oral)  Resp 16  Ht 5' 0.5" (1.537 m)  Wt 105 lb 3.2 oz (47.718 kg)  BMI 20.20 kg/m2 Physical Exam  Constitutional: She is oriented to person, place, and time. She appears well-developed and well-nourished. No distress.  HENT:  Head: Normocephalic and atraumatic.  Right Ear: External ear normal.   Left Ear: External ear normal.  Nose: Nose normal.  Mouth/Throat: Oropharynx is clear and moist.  Eyes: Conjunctivae and EOM are normal. Pupils are equal, round, and reactive to light.  Neck: Normal range of motion. Neck supple. Carotid bruit is not present. No thyromegaly present.  Cardiovascular: Normal  rate, regular rhythm, normal heart sounds and intact distal pulses.  Exam reveals no gallop and no friction rub.   No murmur heard. Pulmonary/Chest: Effort normal and breath sounds normal. She has no wheezes. She has no rales.  Abdominal: Soft. Bowel sounds are normal. She exhibits no distension and no mass. There is no tenderness. There is no rebound and no guarding.  Lymphadenopathy:    She has no cervical adenopathy.  Neurological: She is alert and oriented to person, place, and time. No cranial nerve deficit.  Skin: Skin is warm and dry. No rash noted. She is not diaphoretic. No erythema. No pallor.  Psychiatric: She has a normal mood and affect. Her behavior is normal.   Results for orders placed or performed in visit on 05/22/15  COMPLETE METABOLIC PANEL WITH GFR  Result Value Ref Range   Sodium 138 135 - 146 mmol/L   Potassium 5.1 3.5 - 5.3 mmol/L   Chloride 100 98 - 110 mmol/L   CO2 31 20 - 31 mmol/L   Glucose, Bld 88 65 - 99 mg/dL   BUN 15 7 - 25 mg/dL   Creat 0.65 0.50 - 0.99 mg/dL   Total Bilirubin 0.6 0.2 - 1.2 mg/dL   Alkaline Phosphatase 56 33 - 130 U/L   AST 33 10 - 35 U/L   ALT 43 (H) 6 - 29 U/L   Total Protein 7.1 6.1 - 8.1 g/dL   Albumin 4.4 3.6 - 5.1 g/dL   Calcium 9.9 8.6 - 10.4 mg/dL   GFR, Est African American >89 >=60 mL/min   GFR, Est Non African American >89 >=60 mL/min       Assessment & Plan:   1. Essential hypertension   2. Insomnia   3. Cough    1. HTN: controlled with Losartain. Obtain labs.  No changes. 2. Insomnia; improved with increased dose of Trazodone. 3.  Cough: resolved with cessation of Lisinopril.   Orders Placed This  Encounter  Procedures  . COMPLETE METABOLIC PANEL WITH GFR   No orders of the defined types were placed in this encounter.    Return in about 6 months (around 11/19/2015) for complete physical examiniation.     Nichlas Pitera Elayne Guerin, M.D. [facility] [address] [place], [place]  [address] [phone] phone [phone] fax

## 2015-05-23 LAB — COMPLETE METABOLIC PANEL WITH GFR
ALBUMIN: 4.4 g/dL (ref 3.6–5.1)
ALK PHOS: 56 U/L (ref 33–130)
ALT: 43 U/L — AB (ref 6–29)
AST: 33 U/L (ref 10–35)
BUN: 15 mg/dL (ref 7–25)
CO2: 31 mmol/L (ref 20–31)
CREATININE: 0.65 mg/dL (ref 0.50–0.99)
Calcium: 9.9 mg/dL (ref 8.6–10.4)
Chloride: 100 mmol/L (ref 98–110)
GFR, Est African American: >89 mL/min (ref >=60)
GFR, Est Non African American: >89 mL/min (ref >=60)
Glucose, Bld: 88 mg/dL (ref 65–99)
Potassium: 5.1 mmol/L (ref 3.5–5.3)
SODIUM: 138 mmol/L (ref 135–146)
TOTAL PROTEIN: 7.1 g/dL (ref 6.1–8.1)
Total Bilirubin: 0.6 mg/dL (ref 0.2–1.2)

## 2015-05-25 ENCOUNTER — Encounter: Payer: Self-pay | Admitting: Family Medicine

## 2015-06-12 DIAGNOSIS — Z961 Presence of intraocular lens: Secondary | ICD-10-CM | POA: Diagnosis not present

## 2015-06-20 ENCOUNTER — Other Ambulatory Visit: Payer: Self-pay | Admitting: Family Medicine

## 2015-07-07 DIAGNOSIS — B081 Molluscum contagiosum: Secondary | ICD-10-CM | POA: Diagnosis not present

## 2015-07-28 DIAGNOSIS — Z1231 Encounter for screening mammogram for malignant neoplasm of breast: Secondary | ICD-10-CM | POA: Diagnosis not present

## 2015-08-30 ENCOUNTER — Other Ambulatory Visit: Payer: Self-pay | Admitting: Family Medicine

## 2015-11-01 ENCOUNTER — Ambulatory Visit (INDEPENDENT_AMBULATORY_CARE_PROVIDER_SITE_OTHER): Payer: Medicare Other | Admitting: Family Medicine

## 2015-11-01 VITALS — BP 120/70 | HR 79 | Temp 97.9°F | Resp 15 | Ht 60.5 in | Wt 108.8 lb

## 2015-11-01 DIAGNOSIS — K140 Glossitis: Secondary | ICD-10-CM | POA: Diagnosis not present

## 2015-11-01 LAB — VITAMIN B12: Vitamin B-12: 983 pg/mL (ref 200–1100)

## 2015-11-01 NOTE — Progress Notes (Signed)
70 year old woman who is working part-time at [employer]. For many months and perhaps years, she's noticed some papillae at the tip of her tongue and prominent central fissure. This is not bothering her but she doesn't like the way it looks.  She drinks lemon juice every morning. She takes multiple supplements as well.  Objective: Tongue appears relatively normal with multiple 1 mm papillae at the leading edge of the tongue.  Assessment: I suspect the papillae prominence is related to lemon juice. I explained this to the patient.  Plan:Glossitis - Plan: Vitamin B12  Robyn Haber, Md

## 2015-11-01 NOTE — Patient Instructions (Addendum)
I think this is occuring because of the lemon juice.

## 2015-11-28 ENCOUNTER — Other Ambulatory Visit: Payer: Self-pay | Admitting: Family Medicine

## 2015-12-01 ENCOUNTER — Encounter: Payer: Self-pay | Admitting: Family Medicine

## 2015-12-19 ENCOUNTER — Telehealth: Payer: Self-pay

## 2015-12-19 ENCOUNTER — Encounter: Payer: Self-pay | Admitting: Family Medicine

## 2015-12-19 ENCOUNTER — Ambulatory Visit (INDEPENDENT_AMBULATORY_CARE_PROVIDER_SITE_OTHER): Payer: Medicare Other | Admitting: Family Medicine

## 2015-12-19 VITALS — BP 118/70 | HR 71 | Temp 98.2°F | Resp 18 | Ht 60.5 in | Wt 105.6 lb

## 2015-12-19 DIAGNOSIS — G4700 Insomnia, unspecified: Secondary | ICD-10-CM

## 2015-12-19 DIAGNOSIS — E875 Hyperkalemia: Secondary | ICD-10-CM

## 2015-12-19 DIAGNOSIS — I10 Essential (primary) hypertension: Secondary | ICD-10-CM | POA: Diagnosis not present

## 2015-12-19 DIAGNOSIS — Z0000 Encounter for general adult medical examination without abnormal findings: Secondary | ICD-10-CM

## 2015-12-19 DIAGNOSIS — K140 Glossitis: Secondary | ICD-10-CM

## 2015-12-19 DIAGNOSIS — J040 Acute laryngitis: Secondary | ICD-10-CM

## 2015-12-19 LAB — COMPLETE METABOLIC PANEL WITH GFR
ALT: 26 U/L (ref 6–29)
AST: 26 U/L (ref 10–35)
Albumin: 4.5 g/dL (ref 3.6–5.1)
Alkaline Phosphatase: 63 U/L (ref 33–130)
BUN: 13 mg/dL (ref 7–25)
CHLORIDE: 101 mmol/L (ref 98–110)
CO2: 25 mmol/L (ref 20–31)
Calcium: 9.5 mg/dL (ref 8.6–10.4)
Creat: 0.66 mg/dL (ref 0.50–0.99)
GFR, Est African American: >89 mL/min (ref >=60)
GLUCOSE: 92 mg/dL (ref 65–99)
Potassium: 4.3 mmol/L (ref 3.5–5.3)
SODIUM: 138 mmol/L (ref 135–146)
Total Bilirubin: 0.5 mg/dL (ref 0.2–1.2)
Total Protein: 6.8 g/dL (ref 6.1–8.1)

## 2015-12-19 LAB — CBC
HEMATOCRIT: 44.5 % (ref 35.0–45.0)
HEMOGLOBIN: 15.6 g/dL — AB (ref 11.7–15.5)
MCH: 30.5 pg (ref 27.0–33.0)
MCHC: 35.1 g/dL (ref 32.0–36.0)
MCV: 86.9 fL (ref 80.0–100.0)
MPV: 9.3 fL (ref 7.5–12.5)
Platelets: 352 10*3/uL (ref 140–400)
RBC: 5.12 MIL/uL — AB (ref 3.80–5.10)
RDW: 13.5 % (ref 11.0–15.0)
WBC: 6.2 10*3/uL (ref 3.8–10.8)

## 2015-12-19 MED ORDER — HYDROCHLOROTHIAZIDE 12.5 MG PO CAPS: 12.5000 mg | ORAL_CAPSULE | Freq: Every day | ORAL | Status: DC

## 2015-12-19 MED ORDER — TRAZODONE HCL 50 MG PO TABS: 150.0000 mg | ORAL_TABLET | Freq: Every evening | ORAL | Status: DC | PRN

## 2015-12-19 MED ORDER — LOSARTAN POTASSIUM 25 MG PO TABS: 25.0000 mg | ORAL_TABLET | Freq: Every day | ORAL | Status: DC

## 2015-12-19 NOTE — Progress Notes (Signed)
Subjective:    Patient ID: Lacey Burke, female    DOB: 1946/06/08, 70 y.o.   MRN: [id number]  HPI This is a 70 yo female who presents today for Annual Wellness Visit.  Last CPE- 5/17 Mammo- 07/25/14- documented, she thinks she has had since then at [facility] Pap- hysterectomy, goes to gyn Colonoscopy- 01/04/13- 10 yr recall Tdap- 11/17/2007 Zoster- 01/19/15 Prevnar 13- declines Flu- annual Eye- regular Dental- not regularly Exercise- almost every day- run, weights, yoga, zumba  She has had several months of tongue irritation. This has gotten better with avoiding mint, citrus and changing toothpaste. No coating on tongue. Has noticed fissures which have improved over the last two months. Vit B12 level was checked 5/17 and was normal. Occasional irritation, not daily.    Review of Systems  Constitutional: Negative.   HENT: Positive for mouth sores (tongue irritation).   Eyes: Negative.   Respiratory: Negative.   Cardiovascular: Negative.   Gastrointestinal: Negative.   Endocrine: Negative.   Genitourinary: Negative.   Musculoskeletal: Negative.   Skin: Negative.   Allergic/Immunologic: Positive for environmental allergies (occasional, not bad this spring).  Neurological: Negative.   Hematological: Negative.   Psychiatric/Behavioral: Negative.        Objective:   Physical Exam Physical Exam  Constitutional: She is oriented to person, place, and time. She appears well-developed and well-nourished. No distress.  HENT:  Head: Normocephalic and atraumatic.  Right Ear: External ear normal.  Left Ear: External ear normal.  Nose: Nose normal.  Mouth/Throat: Oropharynx is clear and moist. No oropharyngeal exudate. Tongue without edema or erythema, few small fissures.  Eyes: Conjunctivae are normal. Pupils are equal, round, and reactive to light.  Neck: Normal range of motion. Neck supple. No JVD present. No thyromegaly present.  Cardiovascular: Normal rate, regular rhythm, normal  heart sounds and intact distal pulses.   Pulmonary/Chest: Effort normal and breath sounds normal. Right breast exhibits no inverted nipple, no mass, no nipple discharge, no skin change and no tenderness. Left breast exhibits no inverted nipple, no mass, no nipple discharge, no skin change and no tenderness. Breasts are symmetrical.  Abdominal: Soft. Bowel sounds are normal. She exhibits no distension and no mass. There is no tenderness. There is no rebound and no guarding.   Musculoskeletal: Normal range of motion. She exhibits no edema or tenderness.  Lymphadenopathy:    She has no cervical adenopathy.  Neurological: She is alert and oriented to person, place, and time. She has normal reflexes.  Skin: Skin is warm and dry. She is not diaphoretic.  Psychiatric: She has a normal mood and affect. Her behavior is normal. Judgment and thought content normal.  Vitals reviewed.  BP 118/70 mmHg  Pulse 71  Temp(Src) 98.2 F (36.8 C) (Oral)  Resp 18  Ht 5' 0.5" (1.537 m)  Wt 105 lb 9.6 oz (47.9 kg)  BMI 20.28 kg/m2  SpO2 100% Wt Readings from Last 3 Encounters:  12/19/15 105 lb 9.6 oz (47.9 kg)  11/01/15 108 lb 12.8 oz (49.351 kg)  05/22/15 105 lb 3.2 oz (47.718 kg)      Assessment & Plan:  1. Routine physical examination - - Discussed and encouraged healthy lifestyle choices- adequate sleep, regular exercise, stress management and healthy food choices.   2. Essential hypertension - hydrochlorothiazide (MICROZIDE) 12.5 MG capsule; Take 1 capsule (12.5 mg total) by mouth daily.  Dispense: 90 capsule; Refill: 1 - losartan (COZAAR) 25 MG tablet; Take 1 tablet (25 mg total) by mouth daily.  Dispense: 90 tablet; Refill: 1 - COMPLETE METABOLIC PANEL WITH GFR - CBC  3. Insomnia - traZODone (DESYREL) 50 MG tablet; Take 3 tablets (150 mg total) by mouth at bedtime as needed for sleep.  Dispense: 180 tablet; Refill: 2  4. Hyperkalemia - COMPLETE METABOLIC PANEL WITH GFR  5. Glottitis - this  is improving, encouraged her to continue to avoid irritants and RTC if symptoms worsen - COMPLETE METABOLIC PANEL WITH GFR - CBC   Clarene Reamer, FNP-BC  [facility]  12/21/2015 9:55 AM

## 2015-12-19 NOTE — Patient Instructions (Addendum)
Keeping You Healthy  Get These Tests  Blood Pressure- Have your blood pressure checked by your healthcare provider at least once a year.  Normal blood pressure is 120/80.  Weight- Have your body mass index (BMI) calculated to screen for obesity.  BMI is a measure of body fat based on height and weight.  You can calculate your own BMI at [employer]velB[employer].it  Cholesterol- Have your cholesterol checked every year.  Diabetes- Have your blood sugar checked every year if you have high blood pressure, high cholesterol, a family history of diabetes or if you are overweight.  Pap Test - Have a pap test every 1 to 5 years if you have been sexually active.  If you are older than 65 and recent pap tests have been normal you may not need additional pap tests.  In addition, if you have had a hysterectomy  for benign disease additional pap tests are not necessary.  Mammogram-Yearly mammograms are essential for early detection of breast cancer  Screening for Colon Cancer- Colonoscopy starting at age 50. Screening may begin sooner depending on your family history and other health conditions.  Follow up colonoscopy as directed by your Gastroenterologist.  Screening for Osteoporosis- Screening begins at age 33 with bone density scanning, sooner if you are at higher risk for developing Osteoporosis.  Get these medicines  Calcium with Vitamin D- Your body requires 1200-1500 mg of Calcium a day and [phone] IU of Vitamin D a day.  You can only absorb 500 mg of Calcium at a time therefore Calcium must be taken in 2 or 3 separate doses throughout the day.  Hormones- Hormone therapy has been associated with increased risk for certain cancers and heart disease.  Talk to your healthcare provider about if you need relief from menopausal symptoms.  Aspirin- Ask your healthcare provider about taking Aspirin to prevent Heart Disease and Stroke.  Get these Immuniztions  Flu shot- Every fall  Pneumonia shot-  Once after the age of 38; if you are younger ask your healthcare provider if you need a pneumonia shot.  Tetanus- Every ten years.  Zostavax- Once after the age of 47 to prevent shingles.  Take these steps  Don't smoke- Your healthcare provider can help you quit. For tips on how to quit, ask your healthcare provider or go to www.smokefree.gov or call 1-800 QUIT-NOW.  Be physically active- Exercise 5 days a week for a minimum of 30 minutes.  If you are not already physically active, start slow and gradually work up to 30 minutes of moderate physical activity.  Try walking, dancing, bike riding, swimming, etc.  Eat a healthy diet- Eat a variety of healthy foods such as fruits, vegetables, whole grains, low fat milk, low fat cheeses, yogurt, lean meats, chicken, fish, eggs, dried beans, tofu, etc.  For more information go to www.thenutritionsource.org  Dental visit- Brush and floss teeth twice daily; visit your dentist twice a year.  Eye exam- Visit your Optometrist or Ophthalmologist yearly.  Drink alcohol in moderation- Limit alcohol intake to one drink or less a day.  Never drink and drive.  Depression- Your emotional health is as important as your physical health.  If you're feeling down or losing interest in things you normally enjoy, please talk to your healthcare provider.  Seat Belts- can save your life; always wear one  Smoke/Carbon Monoxide detectors- These detectors need to be installed on the appropriate level of your home.  Replace batteries at least once a year.  Violence- If  anyone is threatening or hurting you, please tell your healthcare provider.  Living Will/ Health care power of attorney- Discuss with your healthcare provider and family.   IF you received an x-ray today, you will receive an invoice from [facility]. Please contact [facility] at [phone] with questions or concerns regarding your invoice.   IF you received labwork today, you will  receive an invoice from [facility]. Please contact [facility] at [phone] with questions or concerns regarding your invoice.   Our billing staff will not be able to assist you with questions regarding bills from these companies.  You will be contacted with the lab results as soon as they are available. The fastest way to get your results is to activate your My Chart account. Instructions are located on the last page of this paperwork. If you have not heard from [place] regarding the results in 2 weeks, please contact this office.

## 2015-12-19 NOTE — Telephone Encounter (Signed)
Patient was seen on 12/19/2015 by Tor Netters for a CPE. Per pt, she had a mammogram done by [facility] in January 2017. Requested a copy of results today (12/19/2015) twice.

## 2015-12-26 ENCOUNTER — Encounter: Payer: Self-pay | Admitting: Family Medicine

## 2016-03-01 ENCOUNTER — Ambulatory Visit (INDEPENDENT_AMBULATORY_CARE_PROVIDER_SITE_OTHER): Payer: Medicare Other | Admitting: Family Medicine

## 2016-03-01 ENCOUNTER — Ambulatory Visit (INDEPENDENT_AMBULATORY_CARE_PROVIDER_SITE_OTHER): Payer: Medicare Other

## 2016-03-01 VITALS — BP 122/74 | HR 76 | Temp 97.7°F | Resp 18 | Ht 60.5 in | Wt 109.0 lb

## 2016-03-01 DIAGNOSIS — R0781 Pleurodynia: Secondary | ICD-10-CM

## 2016-03-01 DIAGNOSIS — Z91030 Bee allergy status: Secondary | ICD-10-CM | POA: Diagnosis not present

## 2016-03-01 DIAGNOSIS — S2341XA Sprain of ribs, initial encounter: Secondary | ICD-10-CM | POA: Diagnosis not present

## 2016-03-01 DIAGNOSIS — S29019A Strain of muscle and tendon of unspecified wall of thorax, initial encounter: Secondary | ICD-10-CM | POA: Diagnosis not present

## 2016-03-01 MED ORDER — EPINEPHRINE 0.3 MG/0.3ML IJ SOAJ: 0.3000 mg | Freq: Once | INTRAMUSCULAR | 1 refills | Status: AC

## 2016-03-01 NOTE — Patient Instructions (Addendum)
Thank you for coming in today.  Please continue your regular exercise regimen; however, avoid strenuous activity.  If the rib pain has not greatly improved in the upcoming two weeks, please send me an email.  We will then schedule a bone scan to evaluate the rib pain further.      IF you received an x-ray today, you will receive an invoice from [facility]. Please contact [facility] Radiology at [phone] with questions or concerns regarding your invoice.   IF you received labwork today, you will receive an invoice from [employer]. Please contact [facility] at [phone] with questions or concerns regarding your invoice.   Our billing staff will not be able to assist you with questions regarding bills from these companies.  You will be contacted with the lab results as soon as they are available. The fastest way to get your results is to activate your My Chart account. Instructions are located on the last page of this paperwork. If you have not heard from [place] regarding the results in 2 weeks, please contact this office.

## 2016-03-01 NOTE — Progress Notes (Signed)
By signing my name below, I, Mesha Guinyard, attest that this documentation has been prepared under the direction and in the presence of Reginia Forts, MD.  Electronically Signed: Verlee Monte, Medical Scribe. 03/01/2016. 3:11 PM.  Subjective:    Patient ID: Lacey Burke, female    DOB: [birth year], 70 y.o.   MRN: [id number]  HPI Chief Complaint  Patient presents with  . Other    LEFT SIDE RIB PAIN    HPI Comments: Lacey Burke is a 70 y.o. female who presents to the [facility] complaining of worsening episodic left side rib pain onset a couple of weeks ago. Pt remembers the pain as a stitch in her side that increased to a greater tender area rating it a 5-6/10. Pain is reproduced when her left rib is touched or when she sits/moves a certain way. Pt exercises frequently but hasn't done any exercise out of the ordinary that could be the cause of her pain. Pt didn't notice any changes in pain when she ran for 50 mins this morning. Pt hasn't used ice or a hot pad for relief of her symptoms. Pt denies recent injury to her rib. Pt denies numbness, tingling, burning from her left rib, nausea, vomiting, constipation, irregular urinary symptoms, back pain, or sleep disturbance.  Patient Active Problem List   Diagnosis Date Noted  . Anxiety 02/01/2012    Priority: Medium  . Metatarsal stress fracture of left foot 07/25/2014  . Allergic rhinitis 09/06/2013  . Personal history of other specified conditions 05/04/2013  . Annual physical exam 09/08/2012  . Osteoporosis 02/01/2012  . Trigeminal neuralgia 09/10/2011  . Hypertension 09/10/2011   Past Medical History:  Diagnosis Date  . Allergy    Zyrtec daily.  . Anxiety   . Blood transfusion without reported diagnosis 07/15/1980   SAB at second trimester with IUD; required transfusion.  . Cataract   . GERD (gastroesophageal reflux disease)   . Heart murmur    childhood onset.  Marland Kitchen Hypertension 07/15/1998  . Osteoporosis   .  Trigeminal neuralgia 07/15/1996   s/p surgical procedure 05/15/2013 [facility].   Past Surgical History:  Procedure Laterality Date  . ABDOMINAL HYSTERECTOMY  2007   Fibroids; ovaries resected.  Marland Kitchen BRAIN SURGERY  05/15/2013   surgical procedure for trigeminal neuralgia. [place]  . BREAST SURGERY     Augmentation in 1970's/Implant removal 1990's  . COLONOSCOPY  02/08/2013   normal.  Repeat in 10 years.  [name].  Marland Kitchen COSMETIC SURGERY    . EYE SURGERY    . HAND SURGERY Left august 2014  . NOSE SURGERY  07/15/1978  . Surgical procedure for trigeminal neuralgia  05/15/2013   [facility]  . TONSILECTOMY, ADENOIDECTOMY, BILATERAL MYRINGOTOMY AND TUBES     Allergies  Allergen Reactions  . Bee Venom Rash and Anaphylaxis    ON LEGS  . Mobic [Meloxicam]   . Other Other (See Comments)    Other reaction: sneezing, itchy eyes   Prior to Admission medications   Medication Sig Start Date End Date Taking? Authorizing Provider  alendronate (FOSAMAX) 70 MG tablet Take 70 mg by mouth once a week. Take with a full glass of water on an empty stomach.   Yes Historical Provider, MD  aspirin 81 MG tablet Take 81 mg by mouth daily.   Yes Historical Provider, MD  CALCIUM PO Take 770 mg by mouth.   Yes Historical Provider, MD  Coenzyme Q10 (COQ10 PO) Take 300 mg by mouth.  Yes Historical Provider, MD  EPINEPHrine (EPIPEN 2-PAK) 0.3 mg/0.3 mL IJ SOAJ injection Inject 0.3 mLs (0.3 mg total) into the muscle once. 04/14/15  Yes Stephanie D English, PA  hydrochlorothiazide (MICROZIDE) 12.5 MG capsule Take 1 capsule (12.5 mg total) by mouth daily. 12/19/15  Yes Elby Beck, FNP  losartan (COZAAR) 25 MG tablet Take 1 tablet (25 mg total) by mouth daily. 12/19/15  Yes Elby Beck, FNP  magnesium gluconate (MAGONATE) 500 MG tablet Take 400 mg by mouth.    Yes Historical Provider, MD  Omega-3 Fatty Acids (FISH OIL) 600 MG CAPS Take by mouth.   Yes Historical Provider, MD  polyethylene glycol (MIRALAX / GLYCOLAX) packet Take 17 g  by mouth daily.   Yes Historical Provider, MD  traZODone (DESYREL) 50 MG tablet Take 3 tablets (150 mg total) by mouth at bedtime as needed for sleep. 12/19/15  Yes Elby Beck, FNP  Turmeric Curcumin 500 MG CAPS Take 500 mg by mouth.   Yes Historical Provider, MD  BIOTIN PO Take 5,000 mcg by mouth.     Historical Provider, MD  Cholecalciferol (VITAMIN D-3 PO) Take 1,000 Units by mouth.    Historical Provider, MD   Social History   Social History  . Marital status: Single    Spouse name: N/A  . Number of children: 2  . Years of education: N/A   Occupational History  . retired [employer]  . ESL Art therapist  [employer]    part-time in 2016   Social History Main Topics  . Smoking status: Former Research scientist (life sciences)  . Smokeless tobacco: Never Used  . Alcohol use 1.8 - 2.4 oz/week    3 - 4 Glasses of wine per week     Comment: socailly wine  . Drug use: No  . Sexual activity: No   Other Topics Concern  . Not on file   Social History Narrative   Marital: divorced since 1994 after 20 years of marriage; +dating since 04/2014.       Children: 2 children/sons (38, 30); no grandchildren.  One on the way in 11/2014.      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      Tobacco:  Former smoker; quit in college.      Alcohol:  Socially wine 3 nights per week.      Drugs: none      Exercise:  Three days per week; walking/running/weights. Computer programs stengthening.      ADLs: independent with all ADLs; drives.      Depression screen [facility] 2/9 03/01/2016 12/19/2015 11/01/2015 05/22/2015 02/13/2015  Decreased Interest 0 0 0 0 0  Down, Depressed, Hopeless 0 0 0 0 0  PHQ - 2 Score 0 0 0 0 0   Review of Systems  Gastrointestinal: Negative for constipation, nausea and vomiting.  Genitourinary: Positive for flank pain. Negative for difficulty  urinating, dysuria, frequency, hematuria and urgency.  Musculoskeletal: Negative for back pain.  Neurological: Negative for numbness.  Psychiatric/Behavioral: Negative for sleep disturbance.   Objective:  Physical Exam  Constitutional: She is oriented to person, place, and time. She appears well-developed and well-nourished. No distress.  HENT:  Head: Normocephalic and atraumatic.  Eyes: Conjunctivae are normal. Pupils are equal, round, and reactive to light.  Neck: Normal range  of motion. Neck supple.  Cardiovascular: Normal rate, regular rhythm and normal heart sounds.  Exam reveals no gallop and no friction rub.   No murmur heard. Pulmonary/Chest: Effort normal and breath sounds normal. She has no wheezes. She has no rales.    Musculoskeletal:       Cervical back: Normal. She exhibits normal range of motion and no tenderness.       Thoracic back: Normal. She exhibits normal range of motion and no tenderness.       Lumbar back: Normal. She exhibits normal range of motion, no tenderness and no bony tenderness.       Arms: TTP left lateral ribs at the distal edge of the rib FROM on lumbar spine without reproducible pain  Neurological: She is alert and oriented to person, place, and time.  Skin: Skin is warm and dry. She is not diaphoretic.  Psychiatric: She has a normal mood and affect. Her behavior is normal.  Nursing note and vitals reviewed.  BP 122/74 (BP Location: Right Arm, Patient Position: Sitting, Cuff Size: Small)   Pulse 76   Temp 97.7 F (36.5 C) (Oral)   Resp 18   Ht 5' 0.5" (1.537 m)   Wt 109 lb (49.4 kg)   SpO2 99%   BMI 20.94 kg/m    No results found. Assessment & Plan:   1. Rib pain on left side   2. Sprain and strain of ribs, initial encounter   3. Bee allergy status    -New. -recommend supportive care with rest, stretching, avoiding heavy lifting for two weeks. -If no improvement in two weeks, RTC to undergo CT chest or bone scan. -refill of Epipen  provided.   Orders Placed This Encounter  Procedures  . DG Ribs Unilateral W/Chest Left   Meds ordered this encounter  Medications  . Omega-3 Fatty Acids (FISH OIL) 600 MG CAPS    Sig: Take by mouth.  . EPINEPHrine (EPIPEN 2-PAK) 0.3 mg/0.3 mL IJ SOAJ injection    Sig: Inject 0.3 mLs (0.3 mg total) into the muscle once.    Dispense:  2 Device    Refill:  1   I personally performed the services described in this documentation, which was scribed in my presence. The recorded information has been reviewed and considered.  Jaslyn Bansal Elayne Guerin, M.D. [facility] [address] [place], [place]  [address] [phone] phone [phone] fax

## 2016-03-07 ENCOUNTER — Encounter: Payer: Self-pay | Admitting: Family Medicine

## 2016-03-10 ENCOUNTER — Encounter: Payer: Self-pay | Admitting: Family Medicine

## 2016-05-11 ENCOUNTER — Encounter: Payer: Self-pay | Admitting: Family Medicine

## 2016-05-11 ENCOUNTER — Ambulatory Visit (INDEPENDENT_AMBULATORY_CARE_PROVIDER_SITE_OTHER): Payer: Medicare Other | Admitting: Family Medicine

## 2016-05-11 VITALS — BP 122/72 | HR 84 | Temp 97.9°F | Resp 17 | Ht 60.5 in | Wt 111.0 lb

## 2016-05-11 DIAGNOSIS — R05 Cough: Secondary | ICD-10-CM

## 2016-05-11 DIAGNOSIS — R059 Cough, unspecified: Secondary | ICD-10-CM

## 2016-05-11 MED ORDER — FLUTICASONE PROPIONATE (INHAL) 50 MCG/BLIST IN AEPB: 1.0000 | INHALATION_SPRAY | Freq: Two times a day (BID) | RESPIRATORY_TRACT | 12 refills | Status: DC

## 2016-05-11 MED ORDER — FLUTICASONE PROPIONATE (INHAL) 50 MCG/BLIST IN AEPB: 2.0000 | INHALATION_SPRAY | Freq: Every day | RESPIRATORY_TRACT | 12 refills | Status: DC

## 2016-05-11 MED ORDER — HYDROCOD POLST-CPM POLST ER 10-8 MG/5ML PO SUER: 5.0000 mL | Freq: Two times a day (BID) | ORAL | 0 refills | Status: DC | PRN

## 2016-05-11 NOTE — Patient Instructions (Addendum)
Start Flovent 2 puffs daily in the morning prior to exercise to prevent exercise induced cough.  For acute cough  Tussionex 1 teaspoon every 12 hours as needed for cough. May cause drowsiness.      IF you received an x-ray today, you will receive an invoice from [facility] Radiology. Please contact [facility] Radiology at [phone] with questions or concerns regarding your invoice.   IFreceived labwork today, you will receive an Pharmacologist from [employer]. Please contact [facility] at [phone] with questions or concerns regarding your invoice.   Our billing staff will not be able to assist you with questions regarding bills from these companies.  You will be contacted with the lab results as soon as they are available. The fastest way to get your results is to activate your My Chart account. Instructions are located on the last page of this paperwork. If you have not heard from [place] regarding the results in 2 weeks, please contact this office.     Fluticasone inhalation aerosol What is this medicine? FLUTICASONE (floo TIK a sone) inhalation is a corticosteroid. It helps decrease inflammation in your lungs. This medicine is used to treat the symptoms of asthma. Never use this medicine for an acute asthma attack. This medicine may be used for other purposes; ask your health care provider or pharmacist if you have questions. What should I tell my health care provider before I take this medicine? They need to know if you have any of these conditions: -bone problems -immune system problems -infection, like chickenpox, tuberculosis, herpes, or fungal infection -recent surgery or injury of mouth or throat -taking corticosteroids by mouth -an unusual or allergic reaction to fluticasone, steroids, other medicines, foods, dyes, or preservatives -pregnant or trying to get pregnant -breast-feeding How should I use this medicine? This medicine is for inhalation through  the mouth. Rinse your mouth with water after use. Make sure not to swallow the water. Follow the directions on your prescription label. Do not use more often than directed. Do not stop taking your medicine unless your doctor tells you to. Make sure that you are using your inhaler correctly. Ask you doctor or health care provider if you have any questions. Talk to your pediatrician regarding the use of this medicine in children. Special care may be needed. While this drug may be prescribed for children as young as 46 years of age for selected conditions, precautions do apply. Overdosage: If you think you have taken too much of this medicine contact a poison control center or emergency room at once. NOTE: This medicine is only for you. Do not share this medicine with others. What if I miss a dose? If you miss a dose, use it as soon as you remember. If it is almost time for your next dose, use only that dose and continue with your regular schedule, spacing doses evenly. Do not use double or extra doses. What may interact with this medicine? -ketoconazole -ritonavir This list may not describe all possible interactions. Give your health care provider a list of all the medicines, herbs, non-prescription drugs, or dietary supplements you use. Also tell them if you smoke, drink alcohol, or use illegal drugs. Some items may interact with your medicine. What should I watch for while using this medicine? Visit your doctor or health care professional for regular checks on your progress. Check with your health care professional if your symptoms do not improve. If your symptoms get worse or if you need your short-acting inhalers  more often, call your doctor right away. Try not to come in contact with people who have chickenpox or the measles while you are taking this medicine. If you do, call your doctor right away. What side effects may I notice from receiving this medicine? Side effects that you should report to  your doctor or health care professional as soon as possible: -allergic reactions like skin rash, itching or hives -changes in vision -chest pain -flu-like symptoms -trouble breathing or wheezing -unusual swelling -white patches or sores in the mouth or throat Side effects that usually do not require medical attention (report to your doctor or health care professional if they continue or are bothersome): -coughing, hoarseness or throat irritation -dry mouth -flushing -headache -loss of taste, or unpleasant taste This list may not describe all possible side effects. Call your doctor for medical advice about side effects. You may report side effects to FDA at 1-800-FDA-1088. Where should I keep my medicine? Keep out of the reach of children. Store at room temperature between 15 and 30 degrees C (59 and 86 degrees F) with the mouthpiece down. Do not puncture the canister. Do not store it or use it near heat or an open flame. Exposure to temperatures above 120 degrees F may cause it to burst. Never throw it into a fire or incinerator. Throw away after the expiration date. NOTE: This sheet is a summary. It may not cover all possible information. If you have questions about this medicine, talk to your doctor, pharmacist, or health care provider.    2016, Elsevier/Gold Standard. (2012-12-17 11:34:29)  Cough, Adult Coughing is a reflex that clears your throat and your airways. Coughing helps to heal and protect your lungs. It is normal to cough occasionally, but a cough that happens with other symptoms or lasts a long time may be a sign of a condition that needs treatment. A cough may last only 2-3 weeks (acute), or it may last longer than 8 weeks (chronic). CAUSES Coughing is commonly caused by:  Breathing in substances that irritate your lungs.  A viral or bacterial respiratory infection.  Allergies.  Asthma.  Postnasal drip.  Smoking.  Acid backing up from the stomach into the  esophagus (gastroesophageal reflux).  Certain medicines.  Chronic lung problems, including COPD (or rarely, lung cancer).  Other medical conditions such as heart failure. HOME CARE INSTRUCTIONS  Pay attention to any changes in your symptoms. Take these actions to help with your discomfort:  Take medicines only as told by your health care provider.  If you were prescribed an antibiotic medicine, take it as told by your health care provider. Do not stop taking the antibiotic even if you start to feel better.  Talk with your health care provider before you take a cough suppressant medicine.  Drink enough fluid to keep your urine clear or pale yellow.  If the air is dry, use a cold steam vaporizer or humidifier in your bedroom or your home to help loosen secretions.  Avoid anything that causes you to cough at work or at home.  If your cough is worse at night, try sleeping in a semi-upright position.  Avoid cigarette smoke. If you smoke, quit smoking. If you need help quitting, ask your health care provider.  Avoid caffeine.  Avoid alcohol.  Rest as needed. SEEK MEDICAL CARE IF:   You have new symptoms.  You cough up pus.  Your cough does not get better after 2-3 weeks, or your cough gets worse.  You cannot control your cough with suppressant medicines and you are losing sleep.  You develop pain that is getting worse or pain that is not controlled with pain medicines.  You have a fever.  You have unexplained weight loss.  You have night sweats. SEEK IMMEDIATE MEDICAL CARE IF:  You cough up blood.  You have difficulty breathing.  Your heartbeat is very fast.   This information is not intended to replace advice given to you by your health care provider. Make sure you discuss any questions you have with your health care provider.   Document Released: 12/28/2010 Document Revised: 03/22/2015 Document Reviewed: 09/07/2014 Elsevier Interactive Patient Education [employer]

## 2016-05-11 NOTE — Progress Notes (Signed)
Patient ID: Lacey Burke, female    DOB: [birth year], 70 y.o.   MRN: [id number]  PCP: Reginia Forts, MD  Chief Complaint  Patient presents with  . Cough  . Immunizations    FLU     Subjective:   HPI 70 year old female presents for evaluation of worsening cough times 1 week. She is an established patient here at [facility]. She reports that she was diagnosed with interstitial lung disease back in August. Specifically the chest x-ray from August did indicate some interstitial markings and chronic bronchitis changes. Patient denies any past smoking history presents as her cough seems to be worsening. Patient reports that she exercised daily at a vigorous rate and lately she notices that she is experiencing an intense hacking cough afterwards. Denies shortness of breath or wheezing. In all she reports that she has had a cough for several months that was thought to be related to Lisinopril which was discontinue and changed to Losartan (Cozaar) 25 mg daily. She reports cough improved but never completely went away. She feels the coughing after vigorous activity is different and requests evaluation. Denies any abdominal or esophageal burning. Patient is very concerned about adding another medication and side effects.   Review of Systems SEE HPI   Patient Active Problem List   Diagnosis Date Noted  . Metatarsal stress fracture of left foot 07/25/2014  . Allergic rhinitis 09/06/2013  . Personal history of other specified conditions 05/04/2013  . Annual physical exam 09/08/2012  . Osteoporosis 02/01/2012  . Anxiety 02/01/2012  . Trigeminal neuralgia 09/10/2011  . Hypertension 09/10/2011     Prior to Admission medications   Medication Sig Start Date End Date Taking? Authorizing Provider  aspirin 81 MG tablet Take 81 mg by mouth daily.   Yes Historical Provider, MD  CALCIUM PO Take 770 mg by mouth.   Yes Historical Provider, MD  Cholecalciferol (VITAMIN D-3 PO) Take 1,000 Units by mouth.    Yes Historical Provider, MD  Coenzyme Q10 (COQ10 PO) Take 300 mg by mouth.   Yes Historical Provider, MD  hydrochlorothiazide (MICROZIDE) 12.5 MG capsule Take 1 capsule (12.5 mg total) by mouth daily. 12/19/15  Yes Elby Beck, FNP  losartan (COZAAR) 25 MG tablet Take 1 tablet (25 mg total) by mouth daily. 12/19/15  Yes Elby Beck, FNP  magnesium gluconate (MAGONATE) 500 MG tablet Take 400 mg by mouth.    Yes Historical Provider, MD  Omega-3 Fatty Acids (FISH OIL) 600 MG CAPS Take by mouth.   Yes Historical Provider, MD  polyethylene glycol (MIRALAX / GLYCOLAX) packet Take 17 g by mouth daily.   Yes Historical Provider, MD  traZODone (DESYREL) 50 MG tablet Take 3 tablets (150 mg total) by mouth at bedtime as needed for sleep. 12/19/15  Yes Elby Beck, FNP  Turmeric Curcumin 500 MG CAPS Take 500 mg by mouth.   Yes Historical Provider, MD  alendronate (FOSAMAX) 70 MG tablet Take 70 mg by mouth once a week. Take with a full glass of water on an empty stomach.    Historical Provider, MD  BIOTIN PO Take 5,000 mcg by mouth.     Historical Provider, MD     Allergies  Allergen Reactions  . Bee Venom Rash and Anaphylaxis    ON LEGS  . Mobic [Meloxicam]   . Other Other (See Comments)    Other reaction: sneezing, itchy eyes       Objective:  Physical Exam  Constitutional: She is oriented  to person, place, and time. She appears well-developed and well-nourished.  HENT:  Head: Normocephalic and atraumatic.  Right Ear: External ear normal.  Left Ear: External ear normal.  Nose: Nose normal.  Mouth/Throat: Oropharynx is clear and moist.  Eyes: Conjunctivae and EOM are normal. Pupils are equal, round, and reactive to light.  Neck: Normal range of motion. Neck supple.  Cardiovascular: Normal rate, regular rhythm, normal heart sounds and intact distal pulses.   Pulmonary/Chest: Effort normal and breath sounds normal. She has no wheezes. She exhibits no tenderness.  Musculoskeletal:  Normal range of motion.  Neurological: She is alert and oriented to person, place, and time.  Skin: Skin is warm and dry.  Psychiatric: She has a normal mood and affect. Her behavior is normal. Judgment and thought content normal.      Vitals:   05/11/16 1208  BP: 122/72  Pulse: 84  Resp: 17  Temp: 97.9 F (36.6 C)   Assessment & Plan:  1. Cough, consider possible exercised induced bronchospasm causing cough. Plan: -Start trial of Fluticasone (Flovent Diskus) 50 mcg/blist inhaler, 2 puffs daily. -For acute coughing chlorpheniramine-hydrocodone (Tussionex) 1 teaspoon every 12 hours as needed for cough. May cause drowsiness.   -If symptoms do not improve, will consider referring to pulmonology for further evaluation.   Follow-up as needed.  Carroll Sage. Kenton Kingfisher, MSN, FNP-C [facility]

## 2016-05-14 ENCOUNTER — Encounter: Payer: Self-pay | Admitting: Family Medicine

## 2016-05-31 ENCOUNTER — Telehealth: Payer: Self-pay

## 2016-05-31 NOTE — Telephone Encounter (Signed)
At per patient request a CD of xray was burned by Rodi and given to the patient.

## 2016-06-20 ENCOUNTER — Encounter: Payer: Self-pay | Admitting: Pulmonary Disease

## 2016-06-20 ENCOUNTER — Ambulatory Visit (INDEPENDENT_AMBULATORY_CARE_PROVIDER_SITE_OTHER): Payer: Medicare Other | Admitting: Pulmonary Disease

## 2016-06-20 VITALS — BP 122/84 | HR 95 | Ht 60.0 in | Wt 108.0 lb

## 2016-06-20 DIAGNOSIS — J849 Interstitial pulmonary disease, unspecified: Secondary | ICD-10-CM

## 2016-06-20 NOTE — Assessment & Plan Note (Signed)
Non smoker, good fxnal capacity.  Does a lot of exercise and physical activity. She pulled a muscle in 02/2016.  CXR with increased interstitial markings concerning for ILD.  She had a CXR in 2006 which did not show increased int markings.  She exercises and has good endurance but maybe more SOB than baseline.  Occasional crackles at baseline.   Plan : I extensively discussed with the patient the  findings on the CXR.  Plan on getting Chest HRCT.   I discussed the consideration of Interstitial Lung Disease (ILD) and the need for extensive work up.   We will obtain the following blood work if ct scan will be diagnostic: CBC, BMP, LFTs, PTT, PT/INR ESR, CRP ANA, anti DS DNA Ab RF,  Anti CCP Ab CK total Anti Jo Ab ANCA (C-ANCA, P-ANCA) Anti Scl Ab HIV test  We discussed re: meds for ILD but may defer as pt is not too symptomatic.   Will await on HRCT chest before proceding with work up.

## 2016-06-20 NOTE — Progress Notes (Signed)
 Subjective:    Patient ID: Lacey Burke, female    DOB: 04/17/1946, 70 y.o.   MRN: [id number]  HPI    This is the case of Lacey Burke, 70 y.o. Female, who is a self referral for "lung disease"    As you very well know, patient is a non smoker, not known to have asthma or copd.   Patient is physically active with exercise.  She does a lot of running and treadmill exercise.She does this daily at least 30 minutes. She goes to the gym 3x/week, an hour, doing light weights and aerobic exercise. Pt strained a muscle in 02/2016.  Her L anterior chest was hurting.  She went to ED. (-) fractures but CXR showed increased intersttial markings.   Her L anterior chest pain is better.   She is back at her baseline. She is concerned about CXR findings.            Review of Systems  Constitutional: Negative.  Negative for fever and unexpected weight change.  HENT: Negative.  Negative for congestion, dental problem, ear pain, nosebleeds, postnasal drip, rhinorrhea, sinus pressure, sneezing, sore throat and trouble swallowing.   Eyes: Negative.  Negative for redness and itching.  Respiratory: Positive for cough. Negative for chest tightness, shortness of breath and wheezing.   Cardiovascular: Negative.  Negative for palpitations and leg swelling.  Gastrointestinal: Negative.  Negative for nausea and vomiting.  Endocrine: Negative.   Genitourinary: Negative.  Negative for dysuria.  Musculoskeletal: Negative.  Negative for joint swelling.  Skin: Negative.  Negative for rash.  Allergic/Immunologic: Positive for environmental allergies.  Neurological: Negative.  Negative for headaches.  Hematological: Negative.  Does not bruise/bleed easily.  Psychiatric/Behavioral: Negative.  Negative for dysphoric mood. The patient is not nervous/anxious.    Past Medical History:  Diagnosis Date  . Allergy    Zyrtec daily.  . Anxiety   . Blood transfusion without reported diagnosis 07/15/1980   SAB at  second trimester with IUD; required transfusion.  . Cataract   . GERD (gastroesophageal reflux disease)   . Heart murmur    childhood onset.  . Hypertension 07/15/1998  . Osteoporosis   . Trigeminal neuralgia 07/15/1996   s/p surgical procedure 05/15/2013 [facility].   (-) CA, DVT  Family History  Problem Relation Age of Onset  . Obesity Mother   . Diabetes Mother   . Hypertension Mother   . Hyperlipidemia Mother   . Dementia Mother   . Diabetes Father   . Arthritis Father     Spinal stenosis  . Irritable bowel syndrome Son   . GER disease Son   . Mental illness Sister     anxiety  . Mental illness Sister     anxiety  . Arthritis Sister 50    spinal stenosis  . Hypertension Brother   . Hypertension Maternal Grandfather      Past Surgical History:  Procedure Laterality Date  . ABDOMINAL HYSTERECTOMY  2007   Fibroids; ovaries resected.  . BRAIN SURGERY  05/15/2013   surgical procedure for trigeminal neuralgia. [facility]  . BREAST SURGERY     Augmentation in 1970's/Implant removal 1990's  . COLONOSCOPY  02/08/2013   normal.  Repeat in 10 years.  Nat [name].  . COSMETIC SURGERY    . EYE SURGERY    . HAND SURGERY Left august 2014  . NOSE SURGERY  07/15/1978  . Surgical procedure for trigeminal neuralgia  05/15/2013   [facility]  . TONSILECTOMY, ADENOIDECTOMY, BILATERAL MYRINGOTOMY   AND TUBES      Social History   Social History  . Marital status: Single    Spouse name: N/A  . Number of children: 2  . Years of education: N/A   Occupational History  . retired [employer]  . 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 Smoking status: Former Smoker    Types: Cigarettes    Quit date: 07/16/1971  . Smokeless tobacco: Never Used  . Alcohol use 1.8 - 2.4 oz/week    3 - 4 Glasses of wine per week     Comment: socailly wine  . Drug use: No  . Sexual activity: No   Other Topics Concern  . Not on file   Social History Narrative    Marital: divorced since 1994 after 20 years of marriage; +dating since 04/2014.       Children: 2 children/sons (38, 30); no grandchildren.  One on the way in 11/2014.      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      Tobacco:  Former smoker; quit in college.      Alcohol:  Socially wine 3 nights per week.      Drugs: none      Exercise:  Three days per week; walking/running/weights. Computer programs stengthening.      ADLs: independent with all ADLs; drives.      Lives in [address].  Allergies  Allergen Reactions  . Bee Venom Rash and Anaphylaxis    ON LEGS  . Mobic [Meloxicam]   . Other Other (See Comments)    Other reaction: sneezing, itchy eyes     Outpatient Medications Prior to Visit  Medication Sig Dispense Refill  . aspirin 81 MG tablet Take 81 mg by mouth daily.    Marland Kitchen CALCIUM PO Take 770 mg by mouth.    . Coenzyme Q10 (COQ10 PO) Take 300 mg by mouth.    . fluticasone (FLOVENT DISKUS) 50 MCG/BLIST diskus inhaler Inhale 2 puffs into the lungs daily. 1 Inhaler 12  . hydrochlorothiazide (MICROZIDE) 12.5 MG capsule Take 1 capsule (12.5 mg total) by mouth daily. 90 capsule 1  . losartan (COZAAR) 25 MG tablet Take 1 tablet (25 mg total) by mouth daily. 90 tablet 1  . magnesium gluconate (MAGONATE) 500 MG tablet Take 400 mg by mouth.     . Omega-3 Fatty Acids (FISH OIL) 600 MG CAPS Take by mouth.    . polyethylene glycol (MIRALAX / GLYCOLAX) packet Take 17 g by mouth daily.    . traZODone (DESYREL) 50 MG tablet Take 3 tablets (150 mg total) by mouth at bedtime as needed for sleep. 180 tablet 2  . chlorpheniramine-HYDROcodone (TUSSIONEX PENNKINETIC ER) 10-8 MG/5ML SUER Take 5 mLs by mouth every 12 (twelve) hours as needed for cough. (Patient not taking: Reported on 06/20/2016) 100 mL 0   No facility-administered medications prior to visit.     No orders of the defined types were placed in this encounter.       Objective:   Physical Exam  Vitals:  Vitals:   06/20/16 1638  BP: 122/84  Pulse: 95  SpO2: 97%  Weight: 108 lb (49 kg)  Height: 5' (1.524 m)    Constitutional/General:  Pleasant, well-nourished,  well-developed, not in any distress,  Comfortably seating.  Well kempt  Body mass index is 21.09 kg/m. Wt Readings from Last 3 Encounters:  06/20/16 108 lb (49 kg)  05/11/16 111 lb (50.3 kg)  03/01/16 109 lb (49.4 kg)    HEENT: Pupils equal and reactive to light and accommodation. Anicteric sclerae. Normal nasal mucosa.   No oral  lesions,  mouth clear,  oropharynx clear, no postnasal drip. (-) Oral thrush. No dental caries.  Airway - Mallampati class III  Neck: No masses. Midline trachea. No JVD, (-) LAD. (-) bruits appreciated.  Respiratory/Chest: Grossly normal chest. (-) deformity. (-) Accessory muscle use.  Symmetric expansion. (-) Tenderness on palpation.  Resonant on percussion.  Diminished BS on both lower lung zones. (-) wheezing, rhonchi. Some crackles bases (-) egophony  Cardiovascular: Regular rate and  rhythm, heart sounds normal, no murmur or gallops, no peripheral edema  Gastrointestinal:  Normal bowel sounds. Soft, non-tender. No hepatosplenomegaly.  (-) masses.   Musculoskeletal:  Normal muscle tone. Normal gait.   Extremities: Grossly normal. (-) clubbing, cyanosis.  (-) edema  Skin: (-) rash,lesions seen.   Neurological/Psychiatric : alert, oriented to time, place, person. Normal mood and affect          Assessment & Plan:  Interstitial lung disease (HCC) Non smoker, good fxnal capacity.  Does a lot of exercise and physical activity. She pulled a muscle in 02/2016.  CXR with increased interstitial markings concerning for ILD.  She had a CXR in 2006 which did not show increased int markings.  She exercises and has good endurance but maybe more SOB than baseline.   Occasional crackles at baseline.   Plan : I extensively discussed with the patient the  findings on the CXR.  Plan on getting Chest HRCT.   I discussed the consideration of Interstitial Lung Disease (ILD) and the need for extensive work up.   We will obtain the following blood work if ct scan will be diagnostic: CBC, BMP, LFTs, PTT, PT/INR ESR, CRP ANA, anti DS DNA Ab RF,  Anti CCP Ab CK total Anti Jo Ab ANCA (C-ANCA, P-ANCA) Anti Scl Ab HIV test  We discussed re: meds for ILD but may defer as pt is not too symptomatic.   Will await on HRCT chest before proceding with work up.      I personally reviewed previous images (Chest Xray, Chest Ct scan) done on this patient. I reviewed the reports on the images as well.    Thank you very much for letting me participate in this patient's care. Please do not hesitate to give me a call if you have any questions or concerns regarding the treatment plan.   Patient will follow up with me in 6 weeks.     J. Angelo A. de Dios, MD 06/20/2016   11:58 PM Pulmonary and Critical Care Medicine [facility] Pager: [phone] Office: [phone], Fax: [phone]  

## 2016-06-20 NOTE — Patient Instructions (Signed)
It was a pleasure taking care of you today!  You are diagnosed with possible Interstitial Lung Disease based on your chest x-ray.  We will schedule you for a chest CT scan.  Return to clinic in 6 weeks with Dr. Corrie Dandy or NP

## 2016-06-27 ENCOUNTER — Ambulatory Visit (INDEPENDENT_AMBULATORY_CARE_PROVIDER_SITE_OTHER)
Admission: RE | Admit: 2016-06-27 | Discharge: 2016-06-27 | Disposition: A | Discharge status: Home / Self Care | Payer: Medicare Other | Source: Ambulatory Visit | Attending: Pulmonary Disease | Admitting: Pulmonary Disease

## 2016-06-27 ENCOUNTER — Inpatient Hospital Stay: Admission: RE | Admit: 2016-06-27 | Payer: Self-pay | Source: Ambulatory Visit

## 2016-06-27 DIAGNOSIS — J849 Interstitial pulmonary disease, unspecified: Secondary | ICD-10-CM

## 2016-07-13 ENCOUNTER — Telehealth: Payer: Self-pay | Admitting: Pulmonary Disease

## 2016-07-13 NOTE — Telephone Encounter (Signed)
   Chest ct scan showed 3 small solid pulmonary nodules in RUL, largest 7 mm. Likely post inflammatory. Pt with low risk for CA. Plan to rpt chest ct scan in 64mos.  If no change, may opt to not follow up or rpt in 1-2 yrs.   Jasmine > pls schedule chest ct scan in 12/2016, no contrast. Dx is lung nodules. tnx  Also, pt has an aberrant R Subclavian artery. This was discussed with pt. No evidence for aneurysm.  Pt denies sx related to this (dysphagia).  Mentioned to pt to f/u with PCP re: this.    Monica Becton, MD 07/13/2016, 8:02 AM [facility] Pager [phone] After 3 pm or if no answer, call [phone]

## 2016-07-16 ENCOUNTER — Telehealth: Payer: Self-pay

## 2016-07-16 DIAGNOSIS — R911 Solitary pulmonary nodule: Secondary | ICD-10-CM

## 2016-07-16 NOTE — Telephone Encounter (Signed)
Spoke with pt. And made sure she was still ok with the order for a repeat CT scan. The pt. Agreed and the order has been placed. Nothing further is needed at this time.  Notes Recorded by Rush Landmark, MD on 07/13/2016 at 8:05 AM EST Ct scan result d/w pt. Jasmine, pls schedule a chest ct scan no contrast in 12/2016. Dx is lung nodules. Thanks. ------

## 2016-07-31 ENCOUNTER — Ambulatory Visit: Payer: Self-pay | Admitting: Pulmonary Disease

## 2016-08-28 ENCOUNTER — Other Ambulatory Visit: Payer: Self-pay | Admitting: Family Medicine

## 2016-08-28 DIAGNOSIS — I10 Essential (primary) hypertension: Secondary | ICD-10-CM

## 2016-09-05 LAB — HM DEXA SCAN

## 2016-09-13 ENCOUNTER — Other Ambulatory Visit: Payer: Self-pay | Admitting: Family Medicine

## 2016-09-13 DIAGNOSIS — I10 Essential (primary) hypertension: Secondary | ICD-10-CM

## 2016-09-20 ENCOUNTER — Other Ambulatory Visit: Payer: Self-pay | Admitting: Family Medicine

## 2016-09-20 DIAGNOSIS — I10 Essential (primary) hypertension: Secondary | ICD-10-CM

## 2016-09-23 ENCOUNTER — Other Ambulatory Visit: Payer: Self-pay | Admitting: Family Medicine

## 2016-10-04 ENCOUNTER — Other Ambulatory Visit: Payer: Self-pay | Admitting: Family Medicine

## 2016-10-04 DIAGNOSIS — I10 Essential (primary) hypertension: Secondary | ICD-10-CM

## 2016-10-04 DIAGNOSIS — G4700 Insomnia, unspecified: Secondary | ICD-10-CM

## 2016-10-10 ENCOUNTER — Other Ambulatory Visit: Payer: Self-pay | Admitting: Family Medicine

## 2016-10-10 DIAGNOSIS — G4700 Insomnia, unspecified: Secondary | ICD-10-CM

## 2016-10-22 ENCOUNTER — Other Ambulatory Visit: Payer: Self-pay | Admitting: Family Medicine

## 2016-10-25 ENCOUNTER — Telehealth: Payer: Self-pay | Admitting: Family Medicine

## 2016-10-25 ENCOUNTER — Other Ambulatory Visit: Payer: Self-pay | Admitting: Physician Assistant

## 2016-10-25 NOTE — Telephone Encounter (Signed)
Pt came into the office & states that she had a persription faxed over to [place] & didn't know the name of it but she said that if someone could contact her ASAP about it. She also states that she may need an EPIPEN.   Please Advise  [phone]

## 2016-10-28 NOTE — Telephone Encounter (Signed)
She needs an office visit, last seen 05/2016

## 2016-10-31 ENCOUNTER — Encounter (INDEPENDENT_AMBULATORY_CARE_PROVIDER_SITE_OTHER): Payer: Medicare Other | Admitting: Family Medicine

## 2016-10-31 NOTE — Patient Instructions (Signed)
     IF you received an x-ray today, you will receive an invoice from [facility]. Please contact [facility] at [phone] with questions or concerns regarding your invoice.   IF you received labwork today, you will receive an invoice from [facility]. Please contact [facility] at [phone] with questions or concerns regarding your invoice.   Our billing staff will not be able to assist you with questions regarding bills from these companies.  You will be contacted with the lab results as soon as they are available. The fastest way to get your results is to activate your My Chart account. Instructions are located on the last page of this paperwork. If you have not heard from us regarding the results in 2 weeks, please contact this office.     

## 2016-10-31 NOTE — Progress Notes (Deleted)
Subjective:    Patient ID: Lacey Burke, female    DOB: 02-06-1946, 71 y.o.   MRN: [id number]  10/31/2016  Follow-up (HTN)   HPI This 71 y.o. female presents for follow-up of hypertension.    Immunization History  Administered Date(s) Administered  . Influenza Split 03/16/2015  . Influenza-Unspecified 05/15/2013, 06/13/2014, 05/11/2016  . Tdap 11/17/2007  . Zoster 01/19/2015   BP Readings from Last 3 Encounters:  10/31/16 134/77  06/20/16 122/84  05/11/16 122/72   Wt Readings from Last 3 Encounters:  10/31/16 109 lb (49.4 kg)  06/20/16 108 lb (49 kg)  05/11/16 111 lb (50.3 kg)      Review of Systems  Constitutional: Negative for chills, diaphoresis, fatigue and fever.  Eyes: Negative for visual disturbance.  Respiratory: Negative for cough and shortness of breath.   Cardiovascular: Negative for chest pain, palpitations and leg swelling.  Gastrointestinal: Negative for abdominal pain, constipation, diarrhea, nausea and vomiting.  Endocrine: Negative for cold intolerance, heat intolerance, polydipsia, polyphagia and polyuria.  Neurological: Negative for dizziness, tremors, seizures, syncope, facial asymmetry, speech difficulty, weakness, light-headedness, numbness and headaches.    Past Medical History:  Diagnosis Date  . Allergy    Zyrtec daily.  . Anxiety   . Blood transfusion without reported diagnosis 07/15/1980   SAB at second trimester with IUD; required transfusion.  . Cataract   . GERD (gastroesophageal reflux disease)   . Heart murmur    childhood onset.  Marland Kitchen Hypertension 07/15/1998  . Osteoporosis   . Trigeminal neuralgia 07/15/1996   s/p surgical procedure 05/15/2013 [facility].   Past Surgical History:  Procedure Laterality Date  . ABDOMINAL HYSTERECTOMY  2007   Fibroids; ovaries resected.  Marland Kitchen BRAIN SURGERY  05/15/2013   surgical procedure for trigeminal neuralgia. [place]  . BREAST SURGERY     Augmentation in 1970's/Implant removal 1990's  . COLONOSCOPY   02/08/2013   normal.  Repeat in 10 years.  [name].  Marland Kitchen COSMETIC SURGERY    . EYE SURGERY    . HAND SURGERY Left august 2014  . NOSE SURGERY  07/15/1978  . Surgical procedure for trigeminal neuralgia  05/15/2013   [facility]  . TONSILECTOMY, ADENOIDECTOMY, BILATERAL MYRINGOTOMY AND TUBES     Allergies  Allergen Reactions  . Bee Venom Rash and Anaphylaxis    ON LEGS  . Mobic [Meloxicam]   . Other Other (See Comments)    Other reaction: sneezing, itchy eyes   Current Outpatient Prescriptions  Medication Sig Dispense Refill  . Acetylcysteine (NAC) 500 MG CAPS Take by mouth.    Marland Kitchen aspirin 81 MG tablet Take 81 mg by mouth daily.    . Calcium-Mag-Vit C-Vit D 185-28-2.5-200 CAPS Take by mouth.    . Coenzyme Q10 (COQ10 PO) Take 300 mg by mouth.    . fluticasone (FLOVENT DISKUS) 50 MCG/BLIST diskus inhaler Inhale 2 puffs into the lungs daily. 1 Inhaler 12  . hydrochlorothiazide (MICROZIDE) 12.5 MG capsule TAKE 1 CAPSULE BY MOUTH ONCE DAILY 30 capsule 0  . losartan (COZAAR) 25 MG tablet TAKE 1 TABLET BY MOUTH ONCE DAILY 30 tablet 0  . Omega-3 Fatty Acids (FISH OIL) 600 MG CAPS Take by mouth.    . polyethylene glycol (MIRALAX / GLYCOLAX) packet Take 17 g by mouth daily.    . traZODone (DESYREL) 50 MG tablet TAKE 3 TABLETS BY MOUTH AT BEDTIME AS NEEDED FOR SLEEP 180 tablet 2  . CALCIUM PO Take 770 mg by mouth.    . chlorpheniramine-HYDROcodone (TUSSIONEX  PENNKINETIC ER) 10-8 MG/5ML SUER Take 5 mLs by mouth every 12 (twelve) hours as needed for cough. (Patient not taking: Reported on 06/20/2016) 100 mL 0  . magnesium gluconate (MAGONATE) 500 MG tablet Take 400 mg by mouth.      No current facility-administered medications for this visit.    Social History   Social History  . Marital status: Divorced    Spouse name: N/A  . Number of children: 2  . Years of education: N/A   Occupational History  . retired [employer]  . 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 Smoking status: Former Smoker    Types: Cigarettes    Quit date: 07/16/1971  . Smokeless tobacco: Never Used  . Alcohol use 1.8 - 2.4 oz/week    3 - 4 Glasses of wine per week     Comment: socailly wine  . Drug use: No  . Sexual activity: No   Other Topics Concern  . Not on file   Social History Narrative   Marital: divorced since 1994 after 20 years of marriage; +dating since 04/2014.       Children: 2 children/sons (38, 30); no grandchildren.  One on the way in 11/2014.      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      Tobacco:  Former smoker; quit in college.      Alcohol:  Socially wine 3 nights per week.      Drugs: none      Exercise:  Three days per week; walking/running/weights. Computer programs stengthening.      ADLs: independent with all ADLs; drives.      Family History  Problem Relation Age of Onset  . Obesity Mother   . Diabetes Mother   . Hypertension Mother   . Hyperlipidemia Mother   . Dementia Mother   . Diabetes Father   . Arthritis Father     Spinal stenosis  . Irritable bowel syndrome Son   . GER disease Son   . Mental illness Sister     anxiety  . Mental illness Sister     anxiety  . Arthritis Sister 50    spinal stenosis  . Hypertension Brother   . Hypertension Maternal Grandfather        Objective:    BP 134/77   Pulse 83   Temp 98.3 F (36.8 C) (Oral)   Resp 16   Ht 5' (1.524 m)   Wt 109 lb (49.4 kg)   SpO2 96%   BMI 21.29 kg/m  Physical Exam  Constitutional: She is oriented to person, place, and time. She appears well-developed and well-nourished. No distress.  HENT:  Head: Normocephalic and atraumatic.  Right Ear: External ear normal.  Left Ear: External ear normal.  Nose: Nose normal.  Mouth/Throat: Oropharynx is clear and moist.  Eyes: Conjunctivae and  EOM are normal. Pupils are equal, round, and reactive to light.  Neck: Normal range of motion. Neck supple. Carotid bruit is not present. No thyromegaly present.  Cardiovascular: Normal rate, regular rhythm, normal heart sounds and intact distal pulses.  Exam reveals no gallop and no friction rub.   No murmur heard. Pulmonary/Chest: Effort normal and breath sounds normal. She has no  wheezes. She has no rales.  Abdominal: Soft. Bowel sounds are normal. She exhibits no distension and no mass. There is no tenderness. There is no rebound and no guarding.  Lymphadenopathy:    She has no cervical adenopathy.  Neurological: She is alert and oriented to person, place, and time. No cranial nerve deficit.  Skin: Skin is warm and dry. No rash noted. She is not diaphoretic. No erythema. No pallor.  Psychiatric: She has a normal mood and affect. Her behavior is normal.   Results for orders placed or performed in visit on 10/10/16  HM DEXA SCAN  Result Value Ref Range   HM Dexa Scan PT HAS OSTEOPOROSIS IN RT HIP        Assessment & Plan:  No diagnosis found.  No orders of the defined types were placed in this encounter.  Meds ordered this encounter  Medications  . Calcium-Mag-Vit C-Vit D 185-28-2.5-200 CAPS    Sig: Take by mouth.  . Acetylcysteine (NAC) 500 MG CAPS    Sig: Take by mouth.    No Follow-up on file.   Kristi Elayne Guerin, M.D. Primary Care at [facility] [address] [place], [place]  [address] [phone] phone [phone] fax

## 2016-11-07 ENCOUNTER — Ambulatory Visit (INDEPENDENT_AMBULATORY_CARE_PROVIDER_SITE_OTHER): Payer: Medicare Other | Admitting: Family Medicine

## 2016-11-07 ENCOUNTER — Encounter: Payer: Self-pay | Admitting: Family Medicine

## 2016-11-07 VITALS — BP 143/78 | HR 64 | Temp 97.6°F | Resp 18 | Ht 60.0 in | Wt 109.2 lb

## 2016-11-07 DIAGNOSIS — J301 Allergic rhinitis due to pollen: Secondary | ICD-10-CM | POA: Diagnosis not present

## 2016-11-07 DIAGNOSIS — Z1159 Encounter for screening for other viral diseases: Secondary | ICD-10-CM

## 2016-11-07 DIAGNOSIS — F5104 Psychophysiologic insomnia: Secondary | ICD-10-CM

## 2016-11-07 DIAGNOSIS — M810 Age-related osteoporosis without current pathological fracture: Secondary | ICD-10-CM

## 2016-11-07 DIAGNOSIS — J849 Interstitial pulmonary disease, unspecified: Secondary | ICD-10-CM | POA: Diagnosis not present

## 2016-11-07 DIAGNOSIS — F419 Anxiety disorder, unspecified: Secondary | ICD-10-CM

## 2016-11-07 DIAGNOSIS — I10 Essential (primary) hypertension: Secondary | ICD-10-CM | POA: Diagnosis not present

## 2016-11-07 MED ORDER — LOSARTAN POTASSIUM 25 MG PO TABS: 25.0000 mg | ORAL_TABLET | Freq: Every day | ORAL | 3 refills | Status: DC

## 2016-11-07 MED ORDER — TRAZODONE HCL 50 MG PO TABS: 150.0000 mg | ORAL_TABLET | Freq: Every evening | ORAL | 3 refills | Status: DC | PRN

## 2016-11-07 MED ORDER — HYDROCHLOROTHIAZIDE 12.5 MG PO CAPS: 12.5000 mg | ORAL_CAPSULE | Freq: Every day | ORAL | 3 refills | Status: DC

## 2016-11-07 NOTE — Progress Notes (Signed)
Subjective:    Patient ID: Lacey Burke, female    DOB: 03-27-1946, 71 y.o.   MRN: [id number]  11/07/2016  Medication Refill (losartan, hydrochlorothiazide )   HPI This 71 y.o. female presents for six month follow-up for hypertension.  Patient reports good compliance with medication, good tolerance to medication, and good symptom control.  Home BP readings running 112/66 56, 128/79, 115/70, 138/84, 139/75 58.    CXR on interstitial lung disease; troubled by dx.  Looked up Fosamax and interstitial; small connection; quit taking it.  s/p bone density scan; imrpoved; still has osteoporosis.  Gynecologist really recommends osteoporosis; has not returned.  Concerned about being susceptible to other lung diseases. Has three nodules.  Exhalation suggests early signs of COPD.  Smoked in college.  Brother asthma. Exercising regularly. Took Fosamax for one year.  Follow-up in six months with pulmonology; also hesitant but concerned about radiation.     Immunization History  Administered Date(s) Administered  . Influenza Split 03/16/2015  . Influenza-Unspecified 05/15/2013, 06/13/2014, 05/11/2016  . Pneumococcal-Unspecified 07/15/2016  . Tdap 11/17/2007  . Zoster 01/19/2015   BP Readings from Last 3 Encounters:  11/07/16 (!) 143/78  10/31/16 134/77  06/20/16 122/84   Wt Readings from Last 3 Encounters:  11/07/16 109 lb 3.2 oz (49.5 kg)  10/31/16 109 lb (49.4 kg)  06/20/16 108 lb (49 kg)    Review of Systems  Constitutional: Negative for chills, diaphoresis, fatigue and fever.  Eyes: Negative for visual disturbance.  Respiratory: Negative for cough and shortness of breath.   Cardiovascular: Negative for chest pain, palpitations and leg swelling.  Gastrointestinal: Negative for abdominal pain, constipation, diarrhea, nausea and vomiting.  Endocrine: Negative for cold intolerance, heat intolerance, polydipsia, polyphagia and polyuria.  Neurological: Negative for dizziness, tremors,  seizures, syncope, facial asymmetry, speech difficulty, weakness, light-headedness, numbness and headaches.  Psychiatric/Behavioral: Positive for sleep disturbance. The patient is nervous/anxious.     Past Medical History:  Diagnosis Date  . Allergy    Zyrtec daily.  . Anxiety   . Blood transfusion without reported diagnosis 07/15/1980   SAB at second trimester with IUD; required transfusion.  . Cataract   . GERD (gastroesophageal reflux disease)   . Heart murmur    childhood onset.  Marland Kitchen Hypertension 07/15/1998  . Osteoporosis   . Trigeminal neuralgia 07/15/1996   s/p surgical procedure 05/15/2013 [facility].   Past Surgical History:  Procedure Laterality Date  . ABDOMINAL HYSTERECTOMY  2007   Fibroids; ovaries resected.  Marland Kitchen BRAIN SURGERY  05/15/2013   surgical procedure for trigeminal neuralgia. [place]  . BREAST SURGERY     Augmentation in 1970's/Implant removal 1990's  . COLONOSCOPY  02/08/2013   normal.  Repeat in 10 years.  [name].  Marland Kitchen COSMETIC SURGERY    . EYE SURGERY    . HAND SURGERY Left august 2014  . NOSE SURGERY  07/15/1978  . Surgical procedure for trigeminal neuralgia  05/15/2013   [facility]  . TONSILECTOMY, ADENOIDECTOMY, BILATERAL MYRINGOTOMY AND TUBES     Allergies  Allergen Reactions  . Bee Venom Rash and Anaphylaxis    ON LEGS  . Mobic [Meloxicam]   . Other Other (See Comments)    Other reaction: sneezing, itchy eyes    Social History   Social History  . Marital status: Divorced    Spouse name: N/A  . Number of children: 2  . Years of education: N/A   Occupational History  . retired [employer]  .  [employer] Art therapist  [place]    part-time in 2016   Social History Main Topics  . Smoking status: Former Smoker    Types: Cigarettes    Quit date: 07/16/1971  . Smokeless tobacco: Never Used  . Alcohol use 1.8 - 2.4 oz/week    3 - 4 Glasses of wine per week     Comment: socailly wine  . Drug use: No  . Sexual activity: No   Other Topics Concern  .  Not on file   Social History Narrative   Marital: divorced since 1994 after 20 years of marriage; +dating since 04/2014.       Children: 2 children/sons (38, 30); no grandchildren.  One on the way in 11/2014.      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      Tobacco:  Former smoker; quit in college.      Alcohol:  Socially wine 3 nights per week.      Drugs: none      Exercise:  Three days per week; walking/running/weights. Computer programs stengthening.      ADLs: independent with all ADLs; drives.      Family History  Problem Relation Age of Onset  . Obesity Mother   . Diabetes Mother   . Hypertension Mother   . Hyperlipidemia Mother   . Dementia Mother   . Diabetes Father   . Arthritis Father        Spinal stenosis  . Irritable bowel syndrome Son   . GER disease Son   . Mental illness Sister        anxiety  . Mental illness Sister        anxiety  . Arthritis Sister 50       spinal stenosis  . Hypertension Brother   . Hypertension Maternal Grandfather        Objective:    BP (!) 143/78   Pulse 64   Temp 97.6 F (36.4 C) (Oral)   Resp 18   Ht 5' (1.524 m)   Wt 109 lb 3.2 oz (49.5 kg)   SpO2 99%   BMI 21.33 kg/m  Physical Exam  Constitutional: She is oriented to person, place, and time. She appears well-developed and well-nourished. No distress.  HENT:  Head: Normocephalic and atraumatic.  Right Ear: External ear normal.  Left Ear: External ear normal.  Nose: Nose normal.  Mouth/Throat: Oropharynx is clear and moist.  Eyes: Conjunctivae and EOM are normal. Pupils are equal, round, and reactive to light.  Neck: Normal range of motion. Neck supple. Carotid bruit is not present. No thyromegaly present.  Cardiovascular: Normal rate, regular rhythm, normal heart sounds and intact distal pulses.  Exam reveals no  gallop and no friction rub.   No murmur heard. Pulmonary/Chest: Effort normal and breath sounds normal. She has no wheezes. She has no rales.  Abdominal: Soft. Bowel sounds are normal. She exhibits no distension and no mass. There is no tenderness. There is no rebound and no guarding.  Lymphadenopathy:    She has no cervical adenopathy.  Neurological: She is alert and oriented to person, place, and time. No cranial nerve deficit.  Skin: Skin is warm and dry. No rash noted. She is not diaphoretic. No erythema. No pallor.  Psychiatric: She has a normal mood and affect. Her behavior  is normal.   Results for orders placed or performed in visit on 11/07/16  CBC with Differential/Platelet  Result Value Ref Range   WBC 6.5 3.4 - 10.8 x10E3/uL   RBC 5.03 3.77 - 5.28 x10E6/uL   Hemoglobin 15.4 11.1 - 15.9 g/dL   Hematocrit 43.0 34.0 - 46.6 %   MCV 86 79 - 97 fL   MCH 30.6 26.6 - 33.0 pg   MCHC 35.8 (H) 31.5 - 35.7 g/dL   RDW 14.0 12.3 - 15.4 %   Platelets 339 150 - 379 x10E3/uL   Neutrophils 66 Not Estab. %   Lymphs 26 Not Estab. %   Monocytes 5 Not Estab. %   Eos 2 Not Estab. %   Basos 1 Not Estab. %   Neutrophils Absolute 4.3 1.4 - 7.0 x10E3/uL   Lymphocytes Absolute 1.7 0.7 - 3.1 x10E3/uL   Monocytes Absolute 0.3 0.1 - 0.9 x10E3/uL   EOS (ABSOLUTE) 0.2 0.0 - 0.4 x10E3/uL   Basophils Absolute 0.0 0.0 - 0.2 x10E3/uL   Immature Granulocytes 0 Not Estab. %   Immature Grans (Abs) 0.0 0.0 - 0.1 x10E3/uL  Comprehensive metabolic panel  Result Value Ref Range   Glucose 94 65 - 99 mg/dL   BUN 20 8 - 27 mg/dL   Creatinine, Ser 0.66 0.57 - 1.00 mg/dL   GFR calc non Af Amer 90 >59 mL/min/1.73   GFR calc Af Amer 104 >59 mL/min/1.73   BUN/Creatinine Ratio 30 (H) 12 - 28   Sodium 139 134 - 144 mmol/L   Potassium 4.0 3.5 - 5.2 mmol/L   Chloride 96 96 - 106 mmol/L   CO2 25 18 - 29 mmol/L   Calcium 10.3 8.7 - 10.3 mg/dL   Total Protein 7.1 6.0 - 8.5 g/dL   Albumin 4.9 (H) 3.6 - 4.8 g/dL    Globulin, Total 2.2 1.5 - 4.5 g/dL   Albumin/Globulin Ratio 2.2 1.2 - 2.2   Bilirubin Total 0.5 0.0 - 1.2 mg/dL   Alkaline Phosphatase 63 39 - 117 IU/L   AST 28 0 - 40 IU/L   ALT 27 0 - 32 IU/L  Hepatitis C antibody  Result Value Ref Range   Hep C Virus Ab <0.1 0.0 - 0.9 s/co ratio       Assessment & Plan:   1. Essential hypertension   2. Age-related osteoporosis without current pathological fracture   3. Psychophysiological insomnia   4. Need for hepatitis C screening test   5. Interstitial lung disease ([place])   6. Anxiety   7. Seasonal allergic rhinitis due to pollen    -moderately controlled hypertension; obtain; labs; refill provided. -s/p one year of Fosamax therapy yet desires to discontinue at this time; exercising daily; continue three servings of dairy daily and vitamin D supplementation. -rx for Trazodone provided; consider restarting Citalopram for anxiety if insomnia worsens; pt to consider. -s/p consultation for abnormal CXR that revealed interstitial lung disease; s/p pulmonology consultation.  Asymptomatic.   Orders Placed This Encounter  Procedures  . CBC with Differential/Platelet  . Comprehensive metabolic panel  . Hepatitis C antibody   Meds ordered this encounter  Medications  . losartan (COZAAR) 25 MG tablet    Sig: Take 1 tablet (25 mg total) by mouth daily.    Dispense:  90 tablet    Refill:  3  . hydrochlorothiazide (MICROZIDE) 12.5 MG capsule    Sig: Take 1 capsule (12.5 mg total) by mouth daily.    Dispense:  90 capsule  Refill:  3  . traZODone (DESYREL) 50 MG tablet    Sig: Take 3 tablets (150 mg total) by mouth at bedtime as needed for sleep.    Dispense:  270 tablet    Refill:  3    Return in about 3 months (around 02/06/2017) for complete physical examiniation.   Azalea Cedar Elayne Guerin, M.D. Primary [facility] at [facility] [place] [address] [place], [place]  [address] [phone] phone [phone] fax

## 2016-11-07 NOTE — Patient Instructions (Signed)
     IF you received an x-ray today, you will receive an invoice from [facility]. Please contact  Radiology at [phone] with questions or concerns regarding your invoice.   IF you received labwork today, you will receive an invoice from [facility]. Please contact [facility] at [phone] with questions or concerns regarding your invoice.   Our billing staff will not be able to assist you with questions regarding bills from these companies.  You will be contacted with the lab results as soon as they are available. The fastest way to get your results is to activate your My Chart account. Instructions are located on the last page of this paperwork. If you have not heard from us regarding the results in 2 weeks, please contact this office.     

## 2016-11-08 LAB — CBC WITH DIFFERENTIAL/PLATELET
Basophils Absolute: 0 10*3/uL (ref 0.0–0.2)
Basos: 1 %
EOS (ABSOLUTE): 0.2 10*3/uL (ref 0.0–0.4)
EOS: 2 %
HEMATOCRIT: 43 % (ref 34.0–46.6)
HEMOGLOBIN: 15.4 g/dL (ref 11.1–15.9)
IMMATURE GRANS (ABS): 0 10*3/uL (ref 0.0–0.1)
Immature Granulocytes: 0 %
LYMPHS: 26 %
Lymphocytes Absolute: 1.7 10*3/uL (ref 0.7–3.1)
MCH: 30.6 pg (ref 26.6–33.0)
MCHC: 35.8 g/dL — AB (ref 31.5–35.7)
MCV: 86 fL (ref 79–97)
Monocytes Absolute: 0.3 10*3/uL (ref 0.1–0.9)
Monocytes: 5 %
NEUTROS ABS: 4.3 10*3/uL (ref 1.4–7.0)
Neutrophils: 66 %
Platelets: 339 10*3/uL (ref 150–379)
RBC: 5.03 x10E6/uL (ref 3.77–5.28)
RDW: 14 % (ref 12.3–15.4)
WBC: 6.5 10*3/uL (ref 3.4–10.8)

## 2016-11-08 LAB — COMPREHENSIVE METABOLIC PANEL
ALBUMIN: 4.9 g/dL — AB (ref 3.6–4.8)
ALK PHOS: 63 IU/L (ref 39–117)
ALT: 27 IU/L (ref 0–32)
AST: 28 IU/L (ref 0–40)
Albumin/Globulin Ratio: 2.2 (ref 1.2–2.2)
BUN / CREAT RATIO: 30 — AB (ref 12–28)
BUN: 20 mg/dL (ref 8–27)
Bilirubin Total: 0.5 mg/dL (ref 0.0–1.2)
CO2: 25 mmol/L (ref 18–29)
CREATININE: 0.66 mg/dL (ref 0.57–1.00)
Calcium: 10.3 mg/dL (ref 8.7–10.3)
Chloride: 96 mmol/L (ref 96–106)
GFR, EST AFRICAN AMERICAN: 104 mL/min/{1.73_m2} (ref >59)
GFR, EST NON AFRICAN AMERICAN: 90 mL/min/{1.73_m2} (ref >59)
GLOBULIN, TOTAL: 2.2 g/dL (ref 1.5–4.5)
Glucose: 94 mg/dL (ref 65–99)
Potassium: 4 mmol/L (ref 3.5–5.2)
Sodium: 139 mmol/L (ref 134–144)
TOTAL PROTEIN: 7.1 g/dL (ref 6.0–8.5)

## 2016-11-08 LAB — HEPATITIS C ANTIBODY: Hep C Virus Ab: <0.1 s/co ratio (ref 0.0–0.9)

## 2016-11-09 ENCOUNTER — Other Ambulatory Visit: Payer: Self-pay

## 2016-11-09 DIAGNOSIS — Z91030 Bee allergy status: Secondary | ICD-10-CM

## 2016-11-09 MED ORDER — EPINEPHRINE 0.3 MG/0.3ML IJ SOAJ: 0.3000 mg | INTRAMUSCULAR | Status: AC

## 2016-11-09 NOTE — Progress Notes (Unsigned)
My chart request for Epi pen Pt forgot to mention to Dr. Aleen Sells

## 2016-12-02 NOTE — Progress Notes (Signed)
This encounter was created in error - please disregard.

## 2016-12-31 ENCOUNTER — Encounter: Payer: Self-pay | Admitting: Family Medicine

## 2017-01-01 MED ORDER — ESCITALOPRAM OXALATE 5 MG PO TABS: 5.0000 mg | ORAL_TABLET | Freq: Every day | ORAL | 1 refills | Status: DC

## 2017-01-13 ENCOUNTER — Encounter: Payer: Self-pay | Admitting: Pulmonary Disease

## 2017-02-23 ENCOUNTER — Encounter: Payer: Self-pay | Admitting: Family Medicine

## 2017-02-23 DIAGNOSIS — I10 Essential (primary) hypertension: Secondary | ICD-10-CM

## 2017-03-03 MED ORDER — LOSARTAN POTASSIUM 50 MG PO TABS: 50.0000 mg | ORAL_TABLET | Freq: Every day | ORAL | 1 refills | Status: DC

## 2017-03-03 NOTE — Addendum Note (Signed)
Addended by: Wardell Honour on: 03/03/2017 12:51 PM   Modules accepted: Orders

## 2017-04-09 ENCOUNTER — Encounter: Payer: Self-pay | Admitting: Family Medicine

## 2017-04-09 ENCOUNTER — Ambulatory Visit (INDEPENDENT_AMBULATORY_CARE_PROVIDER_SITE_OTHER): Payer: Medicare Other | Admitting: Family Medicine

## 2017-04-09 VITALS — BP 126/60 | HR 65 | Temp 98.4°F | Resp 16 | Ht 60.0 in | Wt 109.4 lb

## 2017-04-09 DIAGNOSIS — Z23 Encounter for immunization: Secondary | ICD-10-CM

## 2017-04-09 DIAGNOSIS — I10 Essential (primary) hypertension: Secondary | ICD-10-CM

## 2017-04-09 DIAGNOSIS — M810 Age-related osteoporosis without current pathological fracture: Secondary | ICD-10-CM | POA: Diagnosis not present

## 2017-04-09 DIAGNOSIS — F419 Anxiety disorder, unspecified: Secondary | ICD-10-CM | POA: Diagnosis not present

## 2017-04-09 DIAGNOSIS — Z0000 Encounter for general adult medical examination without abnormal findings: Secondary | ICD-10-CM

## 2017-04-09 NOTE — Patient Instructions (Addendum)

## 2017-04-09 NOTE — Progress Notes (Signed)
QUICK REFERENCE INFORMATION: The ABCs of Providing the Annual Wellness Visit  CMS.gov Medicare Learning Network  [employer] Annual Wellness Visit  Subjective:   Illene Burke is a 71 y.o. Female who presents for an Annual Wellness Visit.  Patient Active Problem List   Diagnosis Date Noted  . Interstitial lung disease ([place]) 06/20/2016  . Metatarsal stress fracture of left foot 07/25/2014  . Allergic rhinitis 09/06/2013  . Personal history of other specified conditions 05/04/2013  . Osteoporosis 02/01/2012  . Anxiety 02/01/2012  . Trigeminal neuralgia 09/10/2011  . Hypertension 09/10/2011    Past Medical History:  Diagnosis Date  . Allergy    Zyrtec daily.  . Anxiety   . Blood transfusion without reported diagnosis 07/15/1980   SAB at second trimester with IUD; required transfusion.  . Cataract   . GERD (gastroesophageal reflux disease)   . Heart murmur    childhood onset.  Marland Kitchen Hypertension 07/15/1998  . Osteoporosis   . Trigeminal neuralgia 07/15/1996   s/p surgical procedure 05/15/2013 [facility].     Past Surgical History:  Procedure Laterality Date  . ABDOMINAL HYSTERECTOMY  2007   Fibroids; ovaries resected.  Marland Kitchen BRAIN SURGERY  05/15/2013   surgical procedure for trigeminal neuralgia. [place]  . BREAST SURGERY     Augmentation in 1970's/Implant removal 1990's  . COLONOSCOPY  02/08/2013   normal.  Repeat in 10 years.  Lacey Burke.  Marland Kitchen COSMETIC SURGERY    . EYE SURGERY    . HAND SURGERY Left august 2014  . NOSE SURGERY  07/15/1978  . Surgical procedure for trigeminal neuralgia  05/15/2013   [facility]  . TONSILECTOMY, ADENOIDECTOMY, BILATERAL MYRINGOTOMY AND TUBES       Outpatient Medications Prior to Visit  Medication Sig Dispense Refill  . aspirin 81 MG tablet Take 81 mg by mouth daily.    Marland Kitchen escitalopram (LEXAPRO) 5 MG tablet Take 1 tablet (5 mg total) by mouth at bedtime. 90 tablet 1  . losartan (COZAAR) 50 MG tablet Take 1 tablet (50 mg total) by mouth daily. 90 tablet 1  .  Acetylcysteine (NAC) 500 MG CAPS Take by mouth.    Marland Kitchen CALCIUM PO Take 770 mg by mouth.    . Calcium-Mag-Vit C-Vit D 185-28-2.5-200 CAPS Take by mouth.    . Coenzyme Q10 (COQ10 PO) Take 300 mg by mouth.    . fluticasone (FLOVENT DISKUS) 50 MCG/BLIST diskus inhaler Inhale 2 puffs into the lungs daily. (Patient not taking: Reported on 11/07/2016) 1 Inhaler 12  . hydrochlorothiazide (MICROZIDE) 12.5 MG capsule Take 1 capsule (12.5 mg total) by mouth daily. (Patient not taking: Reported on 04/09/2017) 90 capsule 3  . magnesium gluconate (MAGONATE) 500 MG tablet Take 400 mg by mouth.     . Omega-3 Fatty Acids (FISH OIL) 600 MG CAPS Take by mouth.    . polyethylene glycol (MIRALAX / GLYCOLAX) packet Take 17 g by mouth daily.    . traZODone (DESYREL) 50 MG tablet Take 3 tablets (150 mg total) by mouth at bedtime as needed for sleep. (Patient not taking: Reported on 04/09/2017) 270 tablet 3   Facility-Administered Medications Prior to Visit  Medication Dose Route Frequency Provider Last Rate Last Dose  . EPINEPHrine (EPI-PEN) injection 0.3 mg  0.3 mg Intramuscular UD Wardell Honour, MD        Allergies  Allergen Reactions  . Bee Venom Rash and Anaphylaxis    ON LEGS  . Mobic [Meloxicam]   . Other Other (See Comments)  Other reaction: sneezing, itchy eyes     Family History  Problem Relation Age of Onset  . Obesity Mother   . Diabetes Mother   . Hypertension Mother   . Hyperlipidemia Mother   . Dementia Mother   . Diabetes Father   . Arthritis Father        Spinal stenosis  . Irritable bowel syndrome Son   . GER disease Son   . Mental illness Sister        anxiety  . Mental illness Sister        anxiety  . Arthritis Sister 50       spinal stenosis  . Hypertension Brother   . Hypertension Maternal Grandfather      Social History   Social History  . Marital status: Divorced    Spouse name: N/A  . Number of children: 2  . Years of education: N/A   Occupational History  .  retired [employer]  . 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 Smoking status: Former Smoker    Types: Cigarettes    Quit date: 07/16/1971  . Smokeless tobacco: Never Used  . Alcohol use 1.8 - 2.4 oz/week    3 - 4 Glasses of wine per week     Comment: socailly wine  . Drug use: No  . Sexual activity: No   Other Topics Concern  . None   Social History Narrative   Marital: divorced since 1994 after 20 years of marriage; +dating since 04/2014.       Children: 2 children/sons (38, 30); no grandchildren.  One on the way in 11/2014.      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      Tobacco:  Former smoker; quit in college.      Alcohol:  Socially wine 3 nights per week.      Drugs: none      Exercise:  Three days per week; walking/running/weights. Computer programs stengthening.      ADLs: independent with all ADLs; drives.         Recent Hospitalizations? no  Health Habits: Current exercise activities include: running/ jogging and weightlifting Exercise: 4 times/week. Diet: in general, a "healthy" diet    Alcohol intake: social sporadic  Health Risk Assessment: The patient has completed a Health Risk Assessment. This has been reveiwed with them and has been scanned into the [place] system as an attached document.  Current Medical Providers and Suppliers: [facility] Patient Care Team: Wardell Honour, MD as PCP - General (Family Medicine) [name], MD as Attending Physician (Obstetrics and Gynecology) No future appointments.   Age-appropriate Screening Schedule: The list below includes current immunization status and future screening recommendations based on patient's age. Orders for these recommended tests are listed in the plan section. The patient has been provided  with a written plan. Immunization History  Administered Date(s) Administered  . Influenza Split 03/16/2015  . Influenza-Unspecified 05/15/2013, 06/13/2014, 05/11/2016  . Pneumococcal Polysaccharide-23 04/09/2017  . Pneumococcal-Unspecified 07/15/2016  . Tdap 11/17/2007  . Zoster 01/19/2015    Health Maintenance reviewed -  Mammogram, pap and bone density up to date     Depression Screen-PHQ2/9 completed today  Depression screen [facility] 2/9 04/09/2017 11/07/2016  10/31/2016 05/11/2016 03/01/2016  Decreased Interest 0 0 0 0 0  Down, Depressed, Hopeless 0 0 0 0 0  PHQ - 2 Score 0 0 0 0 0    Depression Severity and Treatment Recommendations:  0-4= None  5-9= Mild / Treatment: Support, educate to call if worse; return in one month  10-14= Moderate / Treatment: Support, watchful waiting; Antidepressant or Psycotherapy  15-19= Moderately severe / Treatment: Antidepressant OR Psychotherapy  >= 20 = Major depression, severe / Antidepressant AND Psychotherapy  Functional Status Survey: Is the patient deaf or have difficulty hearing?: No Does the patient have difficulty seeing, even when wearing glasses/contacts?: No Does the patient have difficulty concentrating, remembering, or making decisions?: No Does the patient have difficulty walking or climbing stairs?: No Does the patient have difficulty dressing or bathing?: No Does the patient have difficulty doing errands alone such as visiting a doctor's office or shopping?: No  Hearing Evaluation: 1. Do you have trouble hearing the television when others do not? no 2. Do you have to strain to hear/understand conversations? no   Advanced Care Planning: 1. Patient has executed an Advance Directive: no  2. If no, patient was given the opportunity to execute an Advance Directive today? yes  3. Are the patient's advanced directives in [place]? no 4. This patient has the ability to prepare an Advance Directive: yes 5. Provider is willing to follow  the patient's wishes: yes  Cognitive Assessment: Does the patient have evidence of cognitive impairment? no The patient does not have any evidence of any cognitive problems and denies any  change in mood/affect, appearance, speech, memory or motor skills.  Identification of Risk Factors: Risk factors include: hyperlipidemia and hypertension  Review of Systems  Constitutional: Negative for chills and fever.  Respiratory: Negative for cough, shortness of breath and wheezing.   Cardiovascular: Negative for chest pain and palpitations.  Gastrointestinal: Negative for abdominal pain, nausea and vomiting.  Neurological: Negative for dizziness and headaches.  Psychiatric/Behavioral: Negative for depression. The patient is not nervous/anxious.     Objective:   Vitals:   04/09/17 0825  BP: 126/60  Pulse: 65  Resp: 16  Temp: 98.4 F (36.9 C)  TempSrc: Oral  SpO2: 98%  Weight: 109 lb 6.4 oz (49.6 kg)  Height: 5' (1.524 m)    Body mass index is 21.37 kg/m.  BP 126/60 (BP Location: Left Arm, Patient Position: Sitting, Cuff Size: Normal)   Pulse 65   Temp 98.4 F (36.9 C) (Oral)   Resp 16   Ht 5' (1.524 m)   Wt 109 lb 6.4 oz (49.6 kg)   SpO2 98%   BMI 21.37 kg/m   General Appearance:    Alert, cooperative, no distress, appears younger than stated age  Head:    Normocephalic, without obvious abnormality, atraumatic  Eyes:    conjunctiva/corneas clear, EOM's intact, fundi    benign, both eyes  Ears:    Normal TM's and external ear canals, both ears  Throat:   Lips, mucosa, and tongue normal; teeth and gums normal  Neck:   Supple, symmetrical, trachea midline, no adenopathy;    thyroid:  no enlargement/tenderness/nodules; no carotid   bruit or JVD  Back:     Symmetric, no curvature, ROM normal, no CVA tenderness  Lungs:     Clear to auscultation bilaterally, respirations unlabored  Chest Wall:    No tenderness or deformity   Heart:    Regular rate and rhythm, S1 and S2  normal,  no murmur, rub   or gallop  Abdomen:     Soft, non-tender, bowel sounds active all four quadrants,    no masses, no organomegaly  Extremities:   Extremities normal, atraumatic, no cyanosis or edema  Pulses:   2+ and symmetric all extremities  Skin:   Skin color, texture, turgor normal, no rashes or lesions      Assessment/Plan:   Patient Self-Management and Personalized Health Advice The patient has been provided with information about: {use calcium 1 gram daily with Vit D and continue current healthy lifestyle patterns  During the course of the visit the patient was educated and counseled about appropriate screening and preventive services including:   return annually or prn   Body mass index is 21.37 kg/m. Discussed the patient's BMI with her. The BMI BMI is in the acceptable range  Zyasia was seen today for annual exam.  Diagnoses and all orders for this visit:  Medicare annual wellness visit, subsequent- age appropriate screenings reviewed  Need for prophylactic vaccination against Streptococcus pneumoniae (pneumococcus)  Age-related osteoporosis without current pathological fracture -     Comprehensive metabolic panel -     CBC  Anxiety -     TSH  Essential hypertension- bp in good range, checking renal function and screening lipid -     Lipid panel -     Comprehensive metabolic panel  Other orders -     Cancel: Flu Vaccine QUAD 36+ mos IM -     Pneumococcal polysaccharide vaccine 23-valent greater than or equal to 2yo subcutaneous/IM  She will get shingles and flu shot at the pharmacy    No Follow-up on file.  No future appointments.  Patient Instructions  Health Maintenance, Female Adopting a healthy lifestyle and getting preventive care can go a long way to promote health and wellness. Talk with your health care provider about what schedule of regular examinations is right for you. This is a good chance for you to check in with your provider  about disease prevention and staying healthy. In between checkups, there are plenty of things you can do on your own. Experts have done a lot of research about which lifestyle changes and preventive measures are most likely to keep you healthy. Ask your health care provider for more information. Weight and diet Eat a healthy diet  Be sure to include plenty of vegetables, fruits, low-fat dairy products, and lean protein.  Do not eat a lot of foods high in solid fats, added sugars, or salt.  Get regular exercise. This is one of the most important things you can do for your health. ? Most adults should exercise for at least 150 minutes each week. The exercise should increase your heart rate and make you sweat (moderate-intensity exercise). ? Most adults should also do strengthening exercises at least twice a week. This is in addition to the moderate-intensity exercise.  Maintain a healthy weight  Body mass index (BMI) is a measurement that can be used to identify possible weight problems. It estimates body fat based on height and weight. Your health care provider can help determine your BMI and help you achieve or maintain a healthy weight.  For females 36 years of age and older: ? A BMI below 18.5 is considered underweight. ? A BMI of 18.5 to 24.9 is normal. ? A BMI of 25 to 29.9 is considered overweight. ? A BMI of 30 and above is considered obese.  Watch levels of cholesterol and blood lipids  You should start having your blood tested for lipids and cholesterol at 70 years of age, then have this test every 5 years.  You may need to have your cholesterol levels checked more often if: ? Your lipid or cholesterol levels are high. ? You are older than 70 years of age. ? You are at high risk for heart disease.  Cancer screening Lung Cancer  Lung cancer screening is recommended for adults 33-57 years old who are at high risk for lung cancer because of a history of smoking.  A yearly  low-dose CT scan of the lungs is recommended for people who: ? Currently smoke. ? Have quit within the past 15 years. ? Have at least a 30-pack-year history of smoking. A pack year is smoking an average of one pack of cigarettes a day for 1 year.  Yearly screening should continue until it has been 15 years since you quit.  Yearly screening should stop if you develop a health problem that would prevent you from having lung cancer treatment.  Breast Cancer  Practice breast self-awareness. This means understanding how your breasts normally appear and feel.  It also means doing regular breast self-exams. Let your health care provider know about any changes, no matter how small.  If you are in your 20s or 30s, you should have a clinical breast exam (CBE) by a health care provider every 1-3 years as part of a regular health exam.  If you are 31 or older, have a CBE every year. Also consider having a breast X-ray (mammogram) every year.  If you have a family history of breast cancer, talk to your health care provider about genetic screening.  If you are at high risk for breast cancer, talk to your health care provider about having an MRI and a mammogram every year.  Breast cancer gene (BRCA) assessment is recommended for women who have family members with BRCA-related cancers. BRCA-related cancers include: ? Breast. ? Ovarian. ? Tubal. ? Peritoneal cancers.  Results of the assessment will determine the need for genetic counseling and BRCA1 and BRCA2 testing.  Cervical Cancer Your health care provider may recommend that you be screened regularly for cancer of the pelvic organs (ovaries, uterus, and vagina). This screening involves a pelvic examination, including checking for microscopic changes to the surface of your cervix (Pap test). You may be encouraged to have this screening done every 3 years, beginning at age 70.  For women ages 40-65, health care providers may recommend pelvic exams  and Pap testing every 3 years, or they may recommend the Pap and pelvic exam, combined with testing for human papilloma virus (HPV), every 5 years. Some types of HPV increase your risk of cervical cancer. Testing for HPV may also be done on women of any age with unclear Pap test results.  Other health care providers may not recommend any screening for nonpregnant women who are considered low risk for pelvic cancer and who do not have symptoms. Ask your health care provider if a screening pelvic exam is right for you.  If you have had past treatment for cervical cancer or a condition that could lead to cancer, you need Pap tests and screening for cancer for at least 20 years after your treatment. If Pap tests have been discontinued, your risk factors (such as having a new sexual partner) need to be reassessed to determine if screening should resume. Some women have medical problems that increase the chance of getting cervical cancer. In these  cases, your health care provider may recommend more frequent screening and Pap tests.  Colorectal Cancer  This type of cancer can be detected and often prevented.  Routine colorectal cancer screening usually begins at 70 years of age and continues through 70 years of age.  Your health care provider may recommend screening at an earlier age if you have risk factors for colon cancer.  Your health care provider may also recommend using home test kits to check for hidden blood in the stool.  A small camera at the end of a tube can be used to examine your colon directly (sigmoidoscopy or colonoscopy). This is done to check for the earliest forms of colorectal cancer.  Routine screening usually begins at age 98.  Direct examination of the colon should be repeated every 5-10 years through 70 years of age. However, you may need to be screened more often if early forms of precancerous polyps or small growths are found.  Skin Cancer  Check your skin from head to  toe regularly.  Tell your health care provider about any new moles or changes in moles, especially if there is a change in a mole's shape or color.  Also tell your health care provider if you have a mole that is larger than the size of a pencil eraser.  Always use sunscreen. Apply sunscreen liberally and repeatedly throughout the day.  Protect yourself by wearing long sleeves, pants, a wide-brimmed hat, and sunglasses whenever you are outside.  Heart disease, diabetes, and high blood pressure  High blood pressure causes heart disease and increases the risk of stroke. High blood pressure is more likely to develop in: ? People who have blood pressure in the high end of the normal range (130-139/85-89 mm Hg). ? People who are overweight or obese. ? People who are African American.  If you are 45-15 years of age, have your blood pressure checked every 3-5 years. If you are 32 years of age or older, have your blood pressure checked every year. You should have your blood pressure measured twice-once when you are at a hospital or clinic, and once when you are not at a hospital or clinic. Record the average of the two measurements. To check your blood pressure when you are not at a hospital or clinic, you can use: ? An automated blood pressure machine at a pharmacy. ? A home blood pressure monitor.  If you are between 39 years and 63 years old, ask your health care provider if you should take aspirin to prevent strokes.  Have regular diabetes screenings. This involves taking a blood sample to check your fasting blood sugar level. ? If you are at a normal weight and have a low risk for diabetes, have this test once every three years after 70 years of age. ? If you are overweight and have a high risk for diabetes, consider being tested at a younger age or more often. Preventing infection Hepatitis B  If you have a higher risk for hepatitis B, you should be screened for this virus. You are  considered at high risk for hepatitis B if: ? You were born in a country where hepatitis B is common. Ask your health care provider which countries are considered high risk. ? Your parents were born in a high-risk country, and you have not been immunized against hepatitis B (hepatitis B vaccine). ? You have HIV or AIDS. ? You use needles to inject street drugs. ? You live with someone who has  hepatitis B. ? You have had sex with someone who has hepatitis B. ? You get hemodialysis treatment. ? You take certain medicines for conditions, including cancer, organ transplantation, and autoimmune conditions.  Hepatitis C  Blood testing is recommended for: ? Everyone born from [birth year] through 1964. ? Anyone with known risk factors for hepatitis C.  Sexually transmitted infections (STIs)  You should be screened for sexually transmitted infections (STIs) including gonorrhea and chlamydia if: ? You are sexually active and are younger than 70 years of age. ? You are older than 70 years of age and your health care provider tells you that you are at risk for this type of infection. ? Your sexual activity has changed since you were last screened and you are at an increased risk for chlamydia or gonorrhea. Ask your health care provider if you are at risk.  If you do not have HIV, but are at risk, it may be recommended that you take a prescription medicine daily to prevent HIV infection. This is called pre-exposure prophylaxis (PrEP). You are considered at risk if: ? You are sexually active and do not regularly use condoms or know the HIV status of your partner(s). ? You take drugs by injection. ? You are sexually active with a partner who has HIV.  Talk with your health care provider about whether you are at high risk of being infected with HIV. If you choose to begin PrEP, you should first be tested for HIV. You should then be tested every 3 months for as long as you are taking PrEP. Pregnancy  If you  are premenopausal and you may become pregnant, ask your health care provider about preconception counseling.  If you may become pregnant, take 400 to 800 micrograms (mcg) of folic acid every day.  If you want to prevent pregnancy, talk to your health care provider about birth control (contraception). Osteoporosis and menopause  Osteoporosis is a disease in which the bones lose minerals and strength with aging. This can result in serious bone fractures. Your risk for osteoporosis can be identified using a bone density scan.  If you are 38 years of age or older, or if you are at risk for osteoporosis and fractures, ask your health care provider if you should be screened.  Ask your health care provider whether you should take a calcium or vitamin D supplement to lower your risk for osteoporosis.  Menopause may have certain physical symptoms and risks.  Hormone replacement therapy may reduce some of these symptoms and risks. Talk to your health care provider about whether hormone replacement therapy is right for you. Follow these instructions at home:  Schedule regular health, dental, and eye exams.  Stay current with your immunizations.  Do not use any tobacco products including cigarettes, chewing tobacco, or electronic cigarettes.  If you are pregnant, do not drink alcohol.  If you are breastfeeding, limit how much and how often you drink alcohol.  Limit alcohol intake to no more than 1 drink per day for nonpregnant women. One drink equals 12 ounces of beer, 5 ounces of wine, or 1 ounces of hard liquor.  Do not use street drugs.  Do not share needles.  Ask your health care provider for help if you need support or information about quitting drugs.  Tell your health care provider if you often feel depressed.  Tell your health care provider if you have ever been abused or do not feel safe at home. This information is not intended  to replace advice given to you by your health care  provider. Make sure you discuss any questions you have with your health care provider. Document Released: 01/14/2011 Document Revised: 12/07/2015 Document Reviewed: 04/04/2015 Elsevier Interactive Patient Education  [employer].    An after visit summary with all of these plans was given to the patient.

## 2017-04-10 LAB — CBC
Hematocrit: 41.7 % (ref 34.0–46.6)
Hemoglobin: 15.1 g/dL (ref 11.1–15.9)
MCH: 30.4 pg (ref 26.6–33.0)
MCHC: 36.2 g/dL — AB (ref 31.5–35.7)
MCV: 84 fL (ref 79–97)
PLATELETS: 309 10*3/uL (ref 150–379)
RBC: 4.97 x10E6/uL (ref 3.77–5.28)
RDW: 13.8 % (ref 12.3–15.4)
WBC: 5.7 10*3/uL (ref 3.4–10.8)

## 2017-04-10 LAB — COMPREHENSIVE METABOLIC PANEL
A/G RATIO: 2.3 — AB (ref 1.2–2.2)
ALT: 23 IU/L (ref 0–32)
AST: 23 IU/L (ref 0–40)
Albumin: 4.6 g/dL (ref 3.5–4.8)
Alkaline Phosphatase: 69 IU/L (ref 39–117)
BUN/Creatinine Ratio: 23 (ref 12–28)
BUN: 16 mg/dL (ref 8–27)
Bilirubin Total: 0.5 mg/dL (ref 0.0–1.2)
CALCIUM: 9.7 mg/dL (ref 8.7–10.3)
CO2: 22 mmol/L (ref 20–29)
Chloride: 100 mmol/L (ref 96–106)
Creatinine, Ser: 0.71 mg/dL (ref 0.57–1.00)
GFR calc Af Amer: 100 mL/min/{1.73_m2} (ref >59)
GFR, EST NON AFRICAN AMERICAN: 87 mL/min/{1.73_m2} (ref >59)
GLOBULIN, TOTAL: 2 g/dL (ref 1.5–4.5)
Glucose: 98 mg/dL (ref 65–99)
POTASSIUM: 4.3 mmol/L (ref 3.5–5.2)
Sodium: 138 mmol/L (ref 134–144)
Total Protein: 6.6 g/dL (ref 6.0–8.5)

## 2017-04-10 LAB — TSH: TSH: 1.91 u[IU]/mL (ref 0.450–4.500)

## 2017-04-10 LAB — LIPID PANEL
CHOL/HDL RATIO: 1.9 ratio (ref 0.0–4.4)
Cholesterol, Total: 181 mg/dL (ref 100–199)
HDL: 95 mg/dL (ref >39)
LDL CALC: 77 mg/dL (ref 0–99)
TRIGLYCERIDES: 47 mg/dL (ref 0–149)
VLDL Cholesterol Cal: 9 mg/dL (ref 5–40)

## 2017-04-16 ENCOUNTER — Encounter: Payer: Self-pay | Admitting: Family Medicine

## 2017-05-14 ENCOUNTER — Encounter: Payer: Self-pay | Admitting: Family Medicine

## 2017-06-09 ENCOUNTER — Other Ambulatory Visit: Payer: Self-pay | Admitting: Adult Health

## 2017-06-09 DIAGNOSIS — R918 Other nonspecific abnormal finding of lung field: Secondary | ICD-10-CM

## 2017-06-12 ENCOUNTER — Inpatient Hospital Stay: Admission: RE | Admit: 2017-06-12 | Payer: Self-pay | Source: Ambulatory Visit

## 2017-06-24 ENCOUNTER — Other Ambulatory Visit: Payer: Self-pay | Admitting: Family Medicine

## 2017-07-14 ENCOUNTER — Ambulatory Visit: Payer: Medicare Other | Admitting: Family Medicine

## 2017-07-14 ENCOUNTER — Other Ambulatory Visit: Payer: Self-pay

## 2017-07-14 ENCOUNTER — Encounter: Payer: Self-pay | Admitting: Family Medicine

## 2017-07-14 VITALS — BP 142/82 | HR 101 | Temp 98.0°F | Resp 16 | Ht 60.63 in | Wt 109.0 lb

## 2017-07-14 DIAGNOSIS — F419 Anxiety disorder, unspecified: Secondary | ICD-10-CM | POA: Diagnosis not present

## 2017-07-14 DIAGNOSIS — J069 Acute upper respiratory infection, unspecified: Secondary | ICD-10-CM

## 2017-07-14 DIAGNOSIS — F5104 Psychophysiologic insomnia: Secondary | ICD-10-CM | POA: Diagnosis not present

## 2017-07-14 DIAGNOSIS — I10 Essential (primary) hypertension: Secondary | ICD-10-CM

## 2017-07-14 DIAGNOSIS — R51 Headache: Secondary | ICD-10-CM

## 2017-07-14 DIAGNOSIS — G500 Trigeminal neuralgia: Secondary | ICD-10-CM | POA: Diagnosis not present

## 2017-07-14 DIAGNOSIS — R519 Headache, unspecified: Secondary | ICD-10-CM

## 2017-07-14 MED ORDER — ESCITALOPRAM OXALATE 10 MG PO TABS: 10.0000 mg | ORAL_TABLET | Freq: Every day | ORAL | 1 refills | Status: DC

## 2017-07-14 NOTE — Progress Notes (Signed)
Subjective:    Patient ID: Lacey Burke, female    DOB: Nov 20, 1945, 71 y.o.   MRN: [id number]  07/14/2017  Mass (pt states she noticed a lump on the back of her neck Friday that seems to be getting bigger. )    HPI This 71 y.o. female presents for evaluation of possible neck mass.  Two to four weeks ago, noticed R posterior neck swelling; hard; no moveable lump.  Bony prominence.  Scar tissue from trigeminal neuralgia surgery?  Has had PND, nasal congestion with mucious.  Voice is not normal.  Feels well.  Sore throat initially.  Losartan has been recalled; talked to pharmacy states not recalled on patient's lot.    Thinks might need to go increase Lexapro as not sleepng well.  Lexapor 5mg .     BP Readings from Last 3 Encounters:  07/14/17 (!) 142/82  04/09/17 126/60  11/07/16 (!) 143/78   Wt Readings from Last 3 Encounters:  07/14/17 109 lb (49.4 kg)  04/09/17 109 lb 6.4 oz (49.6 kg)  11/07/16 109 lb 3.2 oz (49.5 kg)   Immunization History  Administered Date(s) Administered  . Influenza Split 03/16/2015  . Influenza-Unspecified 05/15/2013, 05/22/2013, 06/13/2014, 05/11/2016  . Pneumococcal Polysaccharide-23 04/09/2017  . Pneumococcal-Unspecified 07/15/2016  . Tdap 11/17/2007  . Zoster 01/19/2015    Review of Systems  Constitutional: Negative for chills, diaphoresis, fatigue and fever.  HENT: Positive for congestion, postnasal drip, rhinorrhea and sore throat. Negative for ear pain, sinus pressure, sinus pain, sneezing, tinnitus, trouble swallowing and voice change.   Eyes: Negative for visual disturbance.  Respiratory: Negative for cough and shortness of breath.   Cardiovascular: Negative for chest pain, palpitations and leg swelling.  Gastrointestinal: Negative for abdominal pain, constipation, diarrhea, nausea and vomiting.  Endocrine: Negative for cold intolerance, heat intolerance, polydipsia, polyphagia and polyuria.  Neurological: Negative for dizziness, tremors,  seizures, syncope, facial asymmetry, speech difficulty, weakness, light-headedness, numbness and headaches.  Psychiatric/Behavioral: Positive for sleep disturbance. Negative for dysphoric mood, self-injury and suicidal ideas. The patient is nervous/anxious.     Past Medical History:  Diagnosis Date  . Allergy    Zyrtec daily.  . Anxiety   . Blood transfusion without reported diagnosis 07/15/1980   SAB at second trimester with IUD; required transfusion.  . Cataract   . GERD (gastroesophageal reflux disease)   . Heart murmur    childhood onset.  Marland Kitchen Hypertension 07/15/1998  . Osteoporosis   . Trigeminal neuralgia 07/15/1996   s/p surgical procedure 05/15/2013 [facility].   Past Surgical History:  Procedure Laterality Date  . ABDOMINAL HYSTERECTOMY  2007   Fibroids; ovaries resected.  Marland Kitchen BRAIN SURGERY  05/15/2013   surgical procedure for trigeminal neuralgia. [place]  . BREAST SURGERY     Augmentation in 1970's/Implant removal 1990's  . COLONOSCOPY  02/08/2013   normal.  Repeat in 10 years.  [name].  Marland Kitchen COSMETIC SURGERY    . EYE SURGERY    . HAND SURGERY Left august 2014  . NOSE SURGERY  07/15/1978  . Surgical procedure for trigeminal neuralgia  05/15/2013   [facility]  . TONSILECTOMY, ADENOIDECTOMY, BILATERAL MYRINGOTOMY AND TUBES     Allergies  Allergen Reactions  . Bee Venom Rash and Anaphylaxis    ON LEGS  . Mobic [Meloxicam]   . Other Other (See Comments)    Other reaction: sneezing, itchy eyes   Current Outpatient Medications on File Prior to Visit  Medication Sig Dispense Refill  . Acetylcysteine (NAC) 500 MG CAPS  Take by mouth.    . Calcium-Mag-Vit C-Vit D 185-28-2.5-200 CAPS Take by mouth.    . Coenzyme Q10 (COQ10 PO) Take 300 mg by mouth.    . losartan (COZAAR) 50 MG tablet Take 1 tablet (50 mg total) by mouth daily. 90 tablet 1  . Omega-3 Fatty Acids (FISH OIL) 600 MG CAPS Take by mouth.    . polyethylene glycol (MIRALAX / GLYCOLAX) packet Take 17 g by mouth daily.     Current  Facility-Administered Medications on File Prior to Visit  Medication Dose Route Frequency Provider Last Rate Last Dose  . EPINEPHrine (EPI-PEN) injection 0.3 mg  0.3 mg Intramuscular UD Wardell Honour, MD       Social History   Socioeconomic History  . Marital status: Divorced    Spouse name: Not on file  . Number of children: 2  . Years of education: Not on file  . Highest education level: Not on file  Social Needs  . Financial resource strain: Not on file  . Food insecurity - worry: Not on file  . Food insecurity - inability: Not on file  . Transportation needs - medical: Not on file  . Transportation needs - non-medical: Not on file  Occupational History  . Occupation: retired    Fish farm manager: [employer]    Comment: Pharmacist, hospital  . 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 Smoking status: Former Smoker    Types: Cigarettes    Last attempt to quit: 07/16/1971    Years since quitting: 46.0  . Smokeless tobacco: Never Used  Substance and Sexual Activity  . Alcohol use: Yes    Alcohol/week: 1.8 - 2.4 oz    Types: 3 - 4 Glasses of wine per week    Comment: socailly wine  . Drug use: No  . Sexual activity: No    Birth control/protection: Post-menopausal  Other Topics Concern  . Not on file  Social History Narrative   Marital: divorced since 1994 after 20 years of marriage; +dating since 04/2014.       Children: 2 children/sons (38, 30); no grandchildren.  One on the way in 11/2014.      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      Tobacco:  Former smoker; quit in college.      Alcohol:  Socially wine 3 nights per week.      Drugs: none      Exercise:  Three days per week; walking/running/weights. Computer programs stengthening.      ADLs: independent with all ADLs; drives.    Family History  Problem Relation Age of Onset  . Obesity Mother   . Diabetes Mother   . Hypertension Mother   . Hyperlipidemia Mother   . Dementia Mother   . Diabetes Father   . Arthritis Father        Spinal stenosis  . Irritable bowel syndrome Son   . GER disease Son   . Mental illness Sister        anxiety  . Mental illness Sister        anxiety  . Arthritis Sister 50       spinal stenosis  . Hypertension Brother   . Hypertension Maternal Grandfather  Objective:    BP (!) 142/82   Pulse (!) 101   Temp 98 F (36.7 C) (Oral)   Resp 16   Ht 5' 0.63" (1.54 m)   Wt 109 lb (49.4 kg)   SpO2 98%   BMI 20.85 kg/m  Physical Exam  Constitutional: She is oriented to person, place, and time. She appears well-developed and well-nourished. No distress.  HENT:  Head: Normocephalic and atraumatic.  Right Ear: External ear normal.  Left Ear: External ear normal.  Nose: Nose normal.  Mouth/Throat: Oropharynx is clear and moist.  Eyes: Conjunctivae and EOM are normal. Pupils are equal, round, and reactive to light.  Neck: Normal range of motion and full passive range of motion without pain. Neck supple. No spinous process tenderness and no muscular tenderness present. Carotid bruit is not present. No neck rigidity. No edema, no erythema and normal range of motion present. No thyromegaly present.    Well healed incision R post-auricular region.   Cardiovascular: Normal rate, regular rhythm, normal heart sounds and intact distal pulses. Exam reveals no gallop and no friction rub.  No murmur heard. Pulmonary/Chest: Effort normal and breath sounds normal. She has no wheezes. She has no rales.  Musculoskeletal:       Cervical back: She exhibits normal range of motion, no tenderness, no bony tenderness, no swelling, no edema, no deformity, no laceration, no pain, no spasm and normal pulse.  Cervical spine: Nontender along midline.  Nontender along paraspinal muscles.  Positive  prominent occipital ridge mostly symmetrical bilaterally.  Possible slight asymmetry along the occipital ridge right slightly more prominent than left.  Palpable asymmetry is bony prominence.  No fluctuance.  No palpable lymph nodes.  No skin abnormalities.  Full range of motion of cervical spine without limitation or pain reproduced.  R post-auricular scar without tenderness, swelling, or pathology. Less subcutaneous tissue lateral to incision in comparison to LEFT post-auricular region.  Lymphadenopathy:    She has no cervical adenopathy.  Neurological: She is alert and oriented to person, place, and time. No cranial nerve deficit.  Skin: Skin is warm and dry. No rash noted. She is not diaphoretic. No erythema. No pallor.  Psychiatric: She has a normal mood and affect. Her behavior is normal.   No results found. Depression screen [facility] 2/9 07/14/2017 04/09/2017 11/07/2016 10/31/2016 05/11/2016  Decreased Interest 0 0 0 0 0  Down, Depressed, Hopeless 0 0 0 0 0  PHQ - 2 Score 0 0 0 0 0   Fall Risk  07/14/2017 04/09/2017 11/07/2016 10/31/2016 05/11/2016  Falls in the past year? No No No No No  Comment - - - - -  Number falls in past yr: - - - - -  Injury with Fall? - - - - -  Comment - - - - -  Risk for fall due to : - - - - -        Assessment & Plan:   1. Pain of occiput   2. Acute upper respiratory infection   3. Trigeminal neuralgia   4. Anxiety   5. Psychophysiological insomnia   6. Essential hypertension    New onset mild discomfort of right posterior occiput with concerns of asymmetry.  Anatomy has been altered due to surgery for trigeminal neuralgia in the right post auricular region.  Asymmetry most likely due to surgery in that location.  Offered patient 3 options: Observation for the next 3 months, school with cervical spine x-rays today, CT of skull to  rule out pathology.  Patient with minimal to actually no discomfort of the area yet appreciated the asymmetry.  Patient desires to  avoid radiation if unnecessary; thus desires to undergo a observation for the upcoming 3 months.  Advised patient to return to clinic if she develops increasing pain, increase bony growth, any further concerns.  Patient has been suffering with a recent upper respiratory infection, yet feels that the symptoms are not correlated with occipital bony prominence.  Recommend supportive care with rest, nasal sprays, decongestants if tolerated.  Increasing anxiety with increasing insomnia.  Recommend increasing Lexapro to 10 mg every evening.  Continue daily exercise for anxiety and depression management.  Reassurance provided that patient's losartan manufacturer has not undergone a recall as patient was advised by her pharmacist.  If patient desires, she could switch to Irbesartan or Olmesartan patient to discuss manufacturer again with pharmacist.  No orders of the defined types were placed in this encounter.  Meds ordered this encounter  Medications  . escitalopram (LEXAPRO) 10 MG tablet    Sig: Take 1 tablet (10 mg total) by mouth at bedtime.    Dispense:  90 tablet    Refill:  1    No Follow-up on file.   Johnni Wunschel Elayne Guerin, M.D. Primary Care at [facility] previously [facility] [address] [place], [place]  [address] [phone] phone [phone] fax

## 2017-07-14 NOTE — Patient Instructions (Addendum)
  Consider Irbesartan and Olmesartan are two options. Contact Reginia Forts if the R posterior neck pulling becomes worse.  IF you received an x-ray today, you will receive an invoice from [facility]. Please contact [facility] at [phone] with questions or concerns regarding your invoice.   IF you received labwork today, you will receive an invoice from [facility]. Please contact [facility] at [phone] with questions or concerns regarding your invoice.   Our billing staff will not be able to assist you with questions regarding bills from these companies.  You will be contacted with the lab results as soon as they are available. The fastest way to get your results is to activate your My Chart account. Instructions are located on the last page of this paperwork. If you have not heard from [place] regarding the results in 2 weeks, please contact this office.

## 2017-07-29 DIAGNOSIS — F5104 Psychophysiologic insomnia: Secondary | ICD-10-CM | POA: Insufficient documentation

## 2017-09-15 ENCOUNTER — Telehealth: Payer: Self-pay | Admitting: Family Medicine

## 2017-09-15 NOTE — Telephone Encounter (Signed)
Copied from Export. Topic: Quick Communication - See Telephone Encounter >> Sep 15, 2017 12:38 PM Cleaster Corin, NT wrote: CRM for notification. See Telephone encounter for:   09/15/17. Pt. Calling to see if she can go ahead and have a different med. Called in other than losartan doesn't know if her has been one of the meds. That have been recalled but due to being several that has been recalled she would like to go ahead and have another med.  [facility], [place] [address] Phone: [phone] Fax: [phone]

## 2017-09-16 NOTE — Telephone Encounter (Signed)
Please advise. [email or web address]

## 2017-09-18 NOTE — Telephone Encounter (Signed)
Pt came by the office today. Pt states that her medication Thurston Hole is on recall and would like to know what medication should she now take. I  talked with P.A. Timmothy Euler and she advise me to take the pts vitals- 130/66, 98.3 temp, 62p, and 98 oxy.  Pt was advise that "we" will send a message to Dr. Tamala Julian and that "we" will call her back tomorrow. Pt states understanding.

## 2017-09-20 MED ORDER — OLMESARTAN MEDOXOMIL 20 MG PO TABS: 20.0000 mg | ORAL_TABLET | Freq: Every day | ORAL | 1 refills | Status: DC

## 2017-09-28 MED ORDER — IRBESARTAN 75 MG PO TABS: 75.0000 mg | ORAL_TABLET | Freq: Every day | ORAL | 1 refills | Status: DC

## 2017-09-28 NOTE — Addendum Note (Signed)
Addended by: Wardell Honour on: 09/28/2017 06:04 PM   Modules accepted: Orders

## 2017-10-04 ENCOUNTER — Ambulatory Visit: Payer: Self-pay | Admitting: Emergency Medicine

## 2017-10-06 ENCOUNTER — Ambulatory Visit: Payer: Medicare Other | Admitting: Physician Assistant

## 2017-10-06 ENCOUNTER — Encounter: Payer: Self-pay | Admitting: Physician Assistant

## 2017-10-06 ENCOUNTER — Other Ambulatory Visit: Payer: Self-pay

## 2017-10-06 VITALS — BP 150/88 | HR 74 | Temp 98.3°F | Resp 16 | Ht 60.23 in | Wt 112.6 lb

## 2017-10-06 DIAGNOSIS — H1032 Unspecified acute conjunctivitis, left eye: Secondary | ICD-10-CM | POA: Diagnosis not present

## 2017-10-06 DIAGNOSIS — J0140 Acute pansinusitis, unspecified: Secondary | ICD-10-CM | POA: Diagnosis not present

## 2017-10-06 MED ORDER — ERYTHROMYCIN 5 MG/GM OP OINT: 1.0000 "application " | TOPICAL_OINTMENT | Freq: Four times a day (QID) | OPHTHALMIC | 0 refills | Status: DC

## 2017-10-06 MED ORDER — DOXYCYCLINE HYCLATE 100 MG PO CAPS: 100.0000 mg | ORAL_CAPSULE | Freq: Two times a day (BID) | ORAL | 0 refills | Status: AC

## 2017-10-06 NOTE — Progress Notes (Signed)
PRIMARY CARE AT [facility] [address], [place] [address] [phone]  Date:  10/06/2017   Name:  Lacey Burke   DOB:  03-17-1946   MRN:  [id number]  PCP:  Wardell Honour, MD    History of Present Illness:  Lacey Burke is a 72 y.o. female patient who presents to PCP with  Chief Complaint  Patient presents with  . left eye green muycuus x this morning.  Woke up with green m    lingering productive cough x few weeks w/ green thick mucus, nasal congestion with thick green mucus.  Per pt she has had several colds this season and cough continues to linger.       She has had sveral respiratory illness.  She has productive cough for a few weeks.  Productive with green mucus.  This is prominent more in the mroning.  Her left eye had green eye mucus 3 days ago, and this morning.  No fever.  No sob or dyspnea.  She has not had any treatment for this.  She takes zyrtec daily.  She is having nasal congestion.  No ear discomfort, or runny nose.     Patient Active Problem List   Diagnosis Date Noted  . Psychophysiological insomnia 07/29/2017  . Interstitial lung disease ([place]) 06/20/2016  . Metatarsal stress fracture of left foot 07/25/2014  . Allergic rhinitis 09/06/2013  . Personal history of other specified conditions 05/04/2013  . Osteoporosis 02/01/2012  . Anxiety 02/01/2012  . Trigeminal neuralgia 09/10/2011  . Hypertension 09/10/2011    Past Medical History:  Diagnosis Date  . Allergy    Zyrtec daily.  . Anxiety   . Blood transfusion without reported diagnosis 07/15/1980   SAB at second trimester with IUD; required transfusion.  . Cataract   . GERD (gastroesophageal reflux disease)   . Heart murmur    childhood onset.  Marland Kitchen Hypertension 07/15/1998  . Osteoporosis   . Trigeminal neuralgia 07/15/1996   s/p surgical procedure 05/15/2013 [facility].    Past Surgical History:  Procedure Laterality Date  . ABDOMINAL HYSTERECTOMY  2007   Fibroids; ovaries resected.  Marland Kitchen BRAIN SURGERY   05/15/2013   surgical procedure for trigeminal neuralgia. [place]  . BREAST SURGERY     Augmentation in 1970's/Implant removal 1990's  . COLONOSCOPY  02/08/2013   normal.  Repeat in 10 years.  [name].  Marland Kitchen COSMETIC SURGERY    . EYE SURGERY    . HAND SURGERY Left august 2014  . NOSE SURGERY  07/15/1978  . Surgical procedure for trigeminal neuralgia  05/15/2013   [facility]  . TONSILECTOMY, ADENOIDECTOMY, BILATERAL MYRINGOTOMY AND TUBES      Social History   Tobacco Use  . Smoking status: Former Smoker    Types: Cigarettes    Last attempt to quit: 07/16/1971    Years since quitting: 46.2  . Smokeless tobacco: Never Used  Substance Use Topics  . Alcohol use: Yes    Alcohol/week: 1.8 - 2.4 oz    Types: 3 - 4 Glasses of wine per week    Comment: socailly wine  . Drug use: No    Family History  Problem Relation Age of Onset  . Obesity Mother   . Diabetes Mother   . Hypertension Mother   . Hyperlipidemia Mother   . Dementia Mother   . Diabetes Father   . Arthritis Father        Spinal stenosis  . Irritable bowel syndrome Son   . GER  disease Son   . Mental illness Sister        anxiety  . Mental illness Sister        anxiety  . Arthritis Sister 50       spinal stenosis  . Hypertension Brother   . Hypertension Maternal Grandfather     Allergies  Allergen Reactions  . Bee Venom Rash and Anaphylaxis    ON LEGS  . Mobic [Meloxicam]   . Other Other (See Comments)    Other reaction: sneezing, itchy eyes    Medication list has been reviewed and updated.  Current Outpatient Medications on File Prior to Visit  Medication Sig Dispense Refill  . Acetylcysteine (NAC) 500 MG CAPS Take by mouth.    . Calcium-Mag-Vit C-Vit D 185-28-2.5-200 CAPS Take by mouth.    . cetirizine (ZYRTEC) 10 MG tablet Take 10 mg by mouth daily.    . Coenzyme Q10 (COQ10 PO) Take 300 mg by mouth.    . collagenase (SANTYL) ointment Apply 1 application topically daily.    Marland Kitchen escitalopram (LEXAPRO) 10 MG tablet  Take 1 tablet (10 mg total) by mouth at bedtime. 90 tablet 1  . Misc Natural Products (TART CHERRY ADVANCED) CAPS Take by mouth.    . olmesartan (BENICAR) 20 MG tablet Take 1 tablet (20 mg total) by mouth daily. 90 tablet 1  . Omega-3 Fatty Acids (FISH OIL) 600 MG CAPS Take by mouth.    . polyethylene glycol (MIRALAX / GLYCOLAX) packet Take 17 g by mouth daily.    . TURMERIC PO Take 200 mg by mouth.    . irbesartan (AVAPRO) 75 MG tablet Take 1 tablet (75 mg total) by mouth daily. (Patient not taking: Reported on 10/06/2017) 90 tablet 1  . losartan (COZAAR) 50 MG tablet Take 1 tablet (50 mg total) by mouth daily. (Patient not taking: Reported on 10/06/2017) 90 tablet 1   Current Facility-Administered Medications on File Prior to Visit  Medication Dose Route Frequency Provider Last Rate Last Dose  . EPINEPHrine (EPI-PEN) injection 0.3 mg  0.3 mg Intramuscular UD Wardell Honour, MD        ROS ROS otherwise unremarkable unless listed above.  Physical Examination: BP (!) 150/88 (BP Location: Right Arm, Patient Position: Sitting, Cuff Size: Normal)   Pulse 74   Temp 98.3 F (36.8 C) (Oral)   Resp 16   Ht 5' 0.23" (1.53 m)   Wt 112 lb 9.6 oz (51.1 kg)   SpO2 97%   BMI 21.82 kg/m  Ideal Body Weight: Weight in (lb) to have BMI = 25: 128.7  Physical Exam  Constitutional: She is oriented to person, place, and time. She appears well-developed and well-nourished. No distress.  HENT:  Head: Normocephalic and atraumatic.  Right Ear: Tympanic membrane, external ear and ear canal normal.  Left Ear: Tympanic membrane, external ear and ear canal normal.  Nose: Mucosal edema and rhinorrhea present. Right sinus exhibits no maxillary sinus tenderness and no frontal sinus tenderness. Left sinus exhibits no maxillary sinus tenderness and no frontal sinus tenderness.  Mouth/Throat: No uvula swelling. No oropharyngeal exudate, posterior oropharyngeal edema or posterior oropharyngeal erythema.  Eyes:  Pupils are equal, round, and reactive to light. EOM are normal. Left conjunctiva is injected.  Cardiovascular: Normal rate and regular rhythm. Exam reveals no gallop, no distant heart sounds and no friction rub.  No murmur heard. Pulmonary/Chest: Effort normal. No respiratory distress. She has no decreased breath sounds. She has no wheezes. She has no  rhonchi.  Lymphadenopathy:       Head (right side): No submandibular, no tonsillar, no preauricular and no posterior auricular adenopathy present.       Head (left side): No submandibular, no tonsillar, no preauricular and no posterior auricular adenopathy present.  Neurological: She is alert and oriented to person, place, and time.  Skin: She is not diaphoretic.  Psychiatric: She has a normal mood and affect. Her behavior is normal.     Assessment and Plan: Jenia Klepper is a 72 y.o. female who is here today for cc of  Chief Complaint  Patient presents with  . left eye green muycuus x this morning.  Woke up with green m    lingering productive cough x few weeks w/ green thick mucus, nasal congestion with thick green mucus.  Per pt she has had several colds this season and cough continues to linger.  Pt bp elevated today not taken medication today, last taken 3/18 and it was 122/67 with a pulse of 66.     Acute non-recurrent pansinusitis - Plan: doxycycline (VIBRAMYCIN) 100 MG capsule, erythromycin (ROMYCIN) ophthalmic ointment  Acute conjunctivitis of left eye, unspecified acute conjunctivitis type  Ivar Drape, PA-C [facility] 3/26/201910:45 AM

## 2017-10-06 NOTE — Patient Instructions (Addendum)
  Please make sure you are hydrating well with water Remember to switch up your allergy medicine to claritin or allegra for now.  Bacterial Conjunctivitis Bacterial conjunctivitis is an infection of your conjunctiva. This is the clear membrane that covers the white part of your eye and the inner surface of your eyelid. This condition can make your eye:  Red or pink.  Itchy.  This condition is caused by bacteria. This condition spreads very easily from person to person (is contagious) and from one eye to the other eye. Follow these instructions at home: Medicines  Take or apply your antibiotic medicine as told by your doctor. Do not stop taking or applying the antibiotic even if you start to feel better.  Take or apply over-the-counter and prescription medicines only as told by your doctor.  Do not touch your eyelid with the eye drop bottle or the ointment tube. Managing discomfort  Wipe any fluid from your eye with a warm, wet washcloth or a cotton ball.  Place a cool, clean washcloth on your eye. Do this for 10-20 minutes, 3-4 times per day. General instructions  Do not wear contact lenses until the irritation is gone. Wear glasses until your doctor says it is okay to wear contacts.  Do not wear eye makeup until your symptoms are gone. Throw away any old makeup.  Change or wash your pillowcase every day.  Do not share towels or washcloths with anyone.  Wash your hands often with soap and water. Use paper towels to dry your hands.  Do not touch or rub your eyes.  Do not drive or use heavy machinery if your vision is blurry. Contact a doctor if:  You have a fever.  Your symptoms do not get better after 10 days. Get help right away if:  You have a fever and your symptoms suddenly get worse.  You have very bad pain when you move your eye.  Your face: ? Hurts. ? Is red. ? Is swollen.  You have sudden loss of vision. This information is not intended to replace  advice given to you by your health care provider. Make sure you discuss any questions you have with your health care provider. Document Released: 04/09/2008 Document Revised: 12/07/2015 Document Reviewed: 04/13/2015 Elsevier Interactive Patient Education  2018 [employer].    IF you received an x-ray today, you will receive an invoice from [facility]. Please contact [facility] at [phone] with questions or concerns regarding your invoice.   IF you received labwork today, you will receive an invoice from [place]. Please contact [facility] at [phone] with questions or concerns regarding your invoice.   Our billing staff will not be able to assist you with questions regarding bills from these companies.  You will be contacted with the lab results as soon as they are available. The fastest way to get your results is to activate your My Chart account. Instructions are located on the last page of this paperwork. If you have not heard from [place] regarding the results in 2 weeks, please contact this office.

## 2017-10-18 ENCOUNTER — Encounter: Payer: Self-pay | Admitting: Physician Assistant

## 2017-10-21 ENCOUNTER — Encounter: Payer: Self-pay | Admitting: Family Medicine

## 2017-10-21 DIAGNOSIS — R059 Cough, unspecified: Secondary | ICD-10-CM

## 2017-10-21 DIAGNOSIS — R05 Cough: Secondary | ICD-10-CM

## 2017-10-22 ENCOUNTER — Encounter: Payer: Self-pay | Admitting: Physician Assistant

## 2017-10-28 ENCOUNTER — Encounter: Payer: Self-pay | Admitting: Family Medicine

## 2017-10-29 ENCOUNTER — Other Ambulatory Visit: Payer: Self-pay

## 2017-11-07 ENCOUNTER — Encounter: Payer: Self-pay | Admitting: Physician Assistant

## 2017-11-07 ENCOUNTER — Ambulatory Visit: Payer: Medicare Other | Admitting: Physician Assistant

## 2017-11-07 ENCOUNTER — Other Ambulatory Visit: Payer: Self-pay

## 2017-11-07 VITALS — BP 132/68 | HR 84 | Temp 98.7°F | Resp 16 | Ht 60.23 in | Wt 111.8 lb

## 2017-11-07 DIAGNOSIS — R059 Cough, unspecified: Secondary | ICD-10-CM

## 2017-11-07 DIAGNOSIS — I10 Essential (primary) hypertension: Secondary | ICD-10-CM | POA: Diagnosis not present

## 2017-11-07 DIAGNOSIS — R05 Cough: Secondary | ICD-10-CM

## 2017-11-07 NOTE — Patient Instructions (Addendum)
Give the cough another 2-4 weeks to resolve. If it doesn't, I agree with imaging (chest xray-the order is in, and you can call [facility] at [phone]). If it does, AND your blood pressure is consistently >140/90, please resume the olmesartan. If the cough returns, we'll need to change you to something else.  Consider Dr. Nolon Rod, Dr. Pamella Pert, [place], [place]  I will be at [facility] beginning in July [phone])  IF you received an x-ray today, you will receive an invoice from [facility]. Please contact [facility] at [phone] with questions or concerns regarding your invoice.   IF you received labwork today, you will receive an invoice from [place]. Please contact [facility] at [phone] with questions or concerns regarding your invoice.   Our billing staff will not be able to assist you with questions regarding bills from these companies.  You will be contacted with the lab results as soon as they are available. The fastest way to get your results is to activate your My Chart account. Instructions are located on the last page of this paperwork. If you have not heard from [place] regarding the results in 2 weeks, please contact this office.

## 2017-11-07 NOTE — Progress Notes (Signed)
Patient ID: Lacey Burke, female    DOB: 1945/07/17, 72 y.o.   MRN: [id number]  PCP: Wardell Honour, MD  Chief Complaint  Patient presents with  . Hypertension    concerns about medications     Subjective:   Presents for evaluation of hypertension.  She was seen by 1 of my colleagues on 10/06/2017 with sinusitis. Sinusitis improved, but cough persisted. Was advised to have a CXR, and due to previous reading of interstitial lung disease, which turned out to be incorrect per pulmonology, she did not proceed with chest x-ray.  Due to possibility that the cough was due to olmesartan, she stopped it about 3 days ago. Has improved some. Feels like it's coming more from her throat. Has previously developed cough with lisinopril. Losartan stopped due to recall.  Irbesartan prescribed, unclear if she ever took it.  Home blood pressure monitoring has revealed no readings greater than 140/90.  Review of Systems As above    Patient Active Problem List   Diagnosis Date Noted  . Psychophysiological insomnia 07/29/2017  . Interstitial lung disease ([place]) 06/20/2016  . Metatarsal stress fracture of left foot 07/25/2014  . Allergic rhinitis 09/06/2013  . Personal history of other specified conditions 05/04/2013  . Osteoporosis 02/01/2012  . Anxiety 02/01/2012  . Trigeminal neuralgia 09/10/2011  . Hypertension 09/10/2011     Prior to Admission medications   Medication Sig Start Date End Date Taking? Authorizing Provider  Acetylcysteine (NAC) 500 MG CAPS Take by mouth.   Yes [provider]  Calcium-Mag-Vit C-Vit D 185-28-2.5-200 CAPS Take by mouth.   Yes [provider]  Coenzyme Q10 (COQ10 PO) Take 300 mg by mouth.   Yes [provider]  COLLAGEN-VITAMIN C PO Take 2,500 mg by mouth.   Yes [provider]  escitalopram (LEXAPRO) 10 MG tablet Take 1 tablet (10 mg total) by mouth at bedtime. 07/14/17  Yes Wardell Honour, MD  Misc Natural  Products [facility] ADVANCED) CAPS Take by mouth.   Yes [provider]  olmesartan (BENICAR) 20 MG tablet Take 1 tablet (20 mg total) by mouth daily. 09/20/17   Wardell Honour, MD  Omega-3 Fatty Acids (FISH OIL) 600 MG CAPS Take by mouth.   Yes [provider]  polyethylene glycol (MIRALAX / GLYCOLAX) packet Take 17 g by mouth daily.   Yes [provider]  TURMERIC PO Take 200 mg by mouth.   Yes [provider]  cetirizine (ZYRTEC) 10 MG tablet Take 10 mg by mouth daily.    [provider]  collagenase (SANTYL) ointment Apply 1 application topically daily.    [provider]  erythromycin [facility]) ophthalmic ointment Place 1 application into the left eye 4 (four) times daily. Patient not taking: Reported on 11/07/2017 10/06/17   Ivar Drape D, PA  irbesartan (AVAPRO) 75 MG tablet Take 1 tablet (75 mg total) by mouth daily. Patient not taking: Reported on 11/07/2017 09/28/17   Wardell Honour, MD  losartan (COZAAR) 50 MG tablet Take 1 tablet (50 mg total) by mouth daily. Patient not taking: Reported on 11/07/2017 03/03/17   Wardell Honour, MD     Allergies  Allergen Reactions  . Bee Venom Rash and Anaphylaxis    ON LEGS  . Mobic [Meloxicam]   . Other Other (See Comments)    Other reaction: sneezing, itchy eyes       Objective:  Physical Exam  Constitutional: She is oriented to person,  place, and time. She appears well-developed and well-nourished. She is active and cooperative. No distress.  BP 132/68   Pulse 84   Temp 98.7 F (37.1 C)   Resp 16   Ht 5' 0.23" (1.53 m)   Wt 111 lb 12.8 oz (50.7 kg)   SpO2 98%   BMI 21.67 kg/m   HENT:  Head: Normocephalic and atraumatic.  Right Ear: Hearing and external ear normal.  Left Ear: Hearing and external ear normal.  Nose: Nose normal.  Mouth/Throat: Oropharynx is clear and moist. No oropharyngeal exudate.  Eyes: Conjunctivae are normal. No scleral icterus.  Neck: Normal  range of motion. Neck supple. No thyromegaly present.  Cardiovascular: Normal rate, regular rhythm and normal heart sounds.  Pulses:      Radial pulses are 2+ on the right side, and 2+ on the left side.  Pulmonary/Chest: Effort normal and breath sounds normal.  Lymphadenopathy:       Head (right side): No tonsillar, no preauricular, no posterior auricular and no occipital adenopathy present.       Head (left side): No tonsillar, no preauricular, no posterior auricular and no occipital adenopathy present.    She has no cervical adenopathy.       Right: No supraclavicular adenopathy present.       Left: No supraclavicular adenopathy present.  Neurological: She is alert and oriented to person, place, and time. No sensory deficit.  Skin: Skin is warm, dry and intact. No rash noted. No cyanosis or erythema. Nails show no clubbing.  Psychiatric: She has a normal mood and affect. Her speech is normal and behavior is normal.           Assessment & Plan:   1. Cough 2. Essential hypertension Unclear if her persistent cough is due to ARB or recent upper respiratory infection.  Advised that cough following URI can last 4 to 6 weeks.  As her blood pressure has remained somewhat stable since stopping olmesartan, she will continue without it for the next 2 weeks.  Presuming her cough will resolve during that time, I recommend that she resume the olmesartan.  If cough recurs she will stop it again and let us know.  Would plan to switch to a calcium channel blocker at that time if blood pressure monitoring reveals consistent readings greater than 140/90.    Return if symptoms worsen or fail to improve.   Fara Chute, PA-C [facility] [place]

## 2017-12-09 ENCOUNTER — Other Ambulatory Visit: Payer: Self-pay

## 2017-12-09 ENCOUNTER — Encounter: Payer: Self-pay | Admitting: Family Medicine

## 2017-12-09 ENCOUNTER — Encounter: Payer: Self-pay | Admitting: Physician Assistant

## 2017-12-09 ENCOUNTER — Ambulatory Visit: Payer: Medicare Other | Admitting: Physician Assistant

## 2017-12-09 VITALS — BP 120/76 | HR 68 | Temp 98.0°F | Resp 16 | Ht 60.24 in | Wt 113.0 lb

## 2017-12-09 DIAGNOSIS — R0781 Pleurodynia: Secondary | ICD-10-CM

## 2017-12-09 NOTE — Progress Notes (Signed)
Lacey Burke  MRN: [id number] DOB: Jan 12, 1946  PCP: Wardell Honour, MD  Chief Complaint  Patient presents with  . Abdominal Pain    more on the right side, pt states Friday she was working out and felt a pulling     Subjective:  Pt presents to clinic for right sided pain that started when she twisted and felt a pop and pain immediately.  Since then it has gotten some better.  She is best today.  She was a little worried because she feels a little bloated today.  She took some motrin the ay of and next day for the pain and that helped - she has not needed anything in the last 48h.  History is obtained by patient.  Review of Systems  Respiratory: Negative for cough and shortness of breath.   Gastrointestinal: Negative for abdominal pain, blood in stool, constipation, diarrhea and nausea.  Genitourinary: Negative.     Patient Active Problem List   Diagnosis Date Noted  . Psychophysiological insomnia 07/29/2017  . Interstitial lung disease ([place]) 06/20/2016  . Metatarsal stress fracture of left foot 07/25/2014  . Allergic rhinitis 09/06/2013  . Personal history of other specified conditions 05/04/2013  . Osteoporosis 02/01/2012  . Anxiety 02/01/2012  . Trigeminal neuralgia 09/10/2011  . Hypertension 09/10/2011    Current Outpatient Medications on File Prior to Visit  Medication Sig Dispense Refill  . Acetylcysteine (NAC) 500 MG CAPS Take by mouth.    . Calcium-Mag-Vit C-Vit D 185-28-2.5-200 CAPS Take by mouth.    . Coenzyme Q10 (COQ10 PO) Take 300 mg by mouth.    . COLLAGEN-VITAMIN C PO Take 2,500 mg by mouth.    . escitalopram (LEXAPRO) 10 MG tablet Take 1 tablet (10 mg total) by mouth at bedtime. 90 tablet 1  . Misc Natural Products (TART CHERRY ADVANCED) CAPS Take by mouth.    . Multiple Vitamin (MULTIVITAMIN) tablet Take by mouth.    . olmesartan (BENICAR) 20 MG tablet Take 1 tablet (20 mg total) by mouth daily. 90 tablet 1  . Omega-3 Fatty Acids (FISH OIL) 600 MG CAPS  Take by mouth.    . polyethylene glycol (MIRALAX / GLYCOLAX) packet Take 17 g by mouth daily.    . TURMERIC PO Take 200 mg by mouth.     Current Facility-Administered Medications on File Prior to Visit  Medication Dose Route Frequency Provider Last Rate Last Dose  . EPINEPHrine (EPI-PEN) injection 0.3 mg  0.3 mg Intramuscular UD Wardell Honour, MD        Allergies  Allergen Reactions  . Bee Venom Rash and Anaphylaxis    ON LEGS  . Mobic [Meloxicam]   . Other Other (See Comments)    Other reaction: sneezing, itchy eyes    Past Medical History:  Diagnosis Date  . Allergy    Zyrtec daily.  . Anxiety   . Blood transfusion without reported diagnosis 07/15/1980   SAB at second trimester with IUD; required transfusion.  . Cataract   . GERD (gastroesophageal reflux disease)   . Heart murmur    childhood onset.  Marland Kitchen Hypertension 07/15/1998  . Osteoporosis   . Trigeminal neuralgia 07/15/1996   s/p surgical procedure 05/15/2013 [facility].   Social History   Social History Narrative   Marital: divorced since 1994 after 20 years of marriage; +dating since 04/2014.       Children: 2 children/sons (38, 30); no grandchildren.  One on the way in 11/2014.  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      Tobacco:  Former smoker; quit in college.      Alcohol:  Socially wine 3 nights per week.      Drugs: none      Exercise:  Three days per week; walking/running/weights. Computer programs stengthening.      ADLs: independent with all ADLs; drives.   Social History   Tobacco Use  . Smoking status: Former Smoker    Types: Cigarettes    Last attempt to quit: 07/16/1971    Years since quitting: 46.4  . Smokeless tobacco: Never Used  Substance Use Topics  . Alcohol use: Yes    Alcohol/week: 1.8 - 2.4 oz    Types: 3 - 4 Glasses of wine per week     Comment: socailly wine  . Drug use: No   family history includes Arthritis in her father; Arthritis (age of onset: 24) in her sister; Dementia in her mother; Diabetes in her father and mother; GER disease in her son; Hyperlipidemia in her mother; Hypertension in her brother, maternal grandfather, and mother; Irritable bowel syndrome in her son; Mental illness in her sister and sister; Obesity in her mother.     Objective:  BP 120/76   Pulse 68   Temp 98 F (36.7 C) (Oral)   Resp 16   Ht 5' 0.24" (1.53 m)   Wt 113 lb (51.3 kg)   SpO2 96%   BMI 21.90 kg/m  Body mass index is 21.9 kg/m.  Physical Exam  Constitutional: She is oriented to person, place, and time. She appears well-developed and well-nourished.  HENT:  Head: Normocephalic and atraumatic.  Right Ear: Hearing and external ear normal.  Left Ear: Hearing and external ear normal.  Eyes: Conjunctivae are normal.  Neck: Normal range of motion.  Cardiovascular: Normal rate, regular rhythm and normal heart sounds.  No murmur heard. Pulmonary/Chest: Effort normal and breath sounds normal. She has no wheezes.    Abdominal: Soft. Normal appearance and bowel sounds are normal. She exhibits no distension. There is no tenderness.  Neurological: She is alert and oriented to person, place, and time.  Skin: Skin is warm, dry and intact.  Psychiatric: She has a normal mood and affect. Her behavior is normal. Judgment and thought content normal.  Vitals reviewed.   Assessment and Plan :  Rib pain - costochondritis from movement while working out - overall pain improvement - ice, rest ok to work out if she does not have pain.  Ok to use motrin if she needs it.  F/u if anything changes.  Patient verbalized to me that she understood the following: their diagnosis, what is being done for it, what to expect and what should be done at home.  See after visit summary for patient specific instructions.     Windell Hummingbird PA-C  [facility] 12/09/2017 2:25 PM

## 2017-12-09 NOTE — Patient Instructions (Signed)
     IF you received an x-ray today, you will receive an invoice from [facility]. Please contact [facility] at [phone] with questions or concerns regarding your invoice.   IF you received labwork today, you will receive an invoice from [facility]. Please contact [facility] at [phone] with questions or concerns regarding your invoice.   Our billing staff will not be able to assist you with questions regarding bills from these companies.  You will be contacted with the lab results as soon as they are available. The fastest way to get your results is to activate your My Chart account. Instructions are located on the last page of this paperwork. If you have not heard from us regarding the results in 2 weeks, please contact this office.     

## 2017-12-24 ENCOUNTER — Encounter: Payer: Self-pay | Admitting: Family Medicine

## 2017-12-24 ENCOUNTER — Ambulatory Visit: Payer: Medicare Other | Admitting: Family Medicine

## 2017-12-24 VITALS — BP 140/86 | HR 73 | Temp 98.5°F | Ht 60.24 in | Wt 113.2 lb

## 2017-12-24 DIAGNOSIS — M810 Age-related osteoporosis without current pathological fracture: Secondary | ICD-10-CM

## 2017-12-24 DIAGNOSIS — I10 Essential (primary) hypertension: Secondary | ICD-10-CM | POA: Diagnosis not present

## 2017-12-24 DIAGNOSIS — R202 Paresthesia of skin: Secondary | ICD-10-CM | POA: Diagnosis not present

## 2017-12-24 DIAGNOSIS — E559 Vitamin D deficiency, unspecified: Secondary | ICD-10-CM | POA: Diagnosis not present

## 2017-12-24 LAB — COMPREHENSIVE METABOLIC PANEL
ALT: 28 U/L (ref 0–35)
AST: 27 U/L (ref 0–37)
Albumin: 4.6 g/dL (ref 3.5–5.2)
Alkaline Phosphatase: 79 U/L (ref 39–117)
BUN: 17 mg/dL (ref 6–23)
CO2: 31 mEq/L (ref 19–32)
Calcium: 10.3 mg/dL (ref 8.4–10.5)
Chloride: 100 mEq/L (ref 96–112)
Creatinine, Ser: 0.66 mg/dL (ref 0.40–1.20)
GFR: 93.81 mL/min (ref >60.00)
Glucose, Bld: 101 mg/dL — ABNORMAL HIGH (ref 70–99)
Potassium: 4.3 mEq/L (ref 3.5–5.1)
Sodium: 138 mEq/L (ref 135–145)
Total Bilirubin: 0.5 mg/dL (ref 0.2–1.2)
Total Protein: 6.9 g/dL (ref 6.0–8.3)

## 2017-12-24 LAB — CBC WITH DIFFERENTIAL/PLATELET
Basophils Absolute: 0.1 10*3/uL (ref 0.0–0.1)
Basophils Relative: 0.6 % (ref 0.0–3.0)
Eosinophils Absolute: 0.1 10*3/uL (ref 0.0–0.7)
Eosinophils Relative: 1.1 % (ref 0.0–5.0)
HCT: 43.9 % (ref 36.0–46.0)
Hemoglobin: 15.2 g/dL — ABNORMAL HIGH (ref 12.0–15.0)
Lymphocytes Relative: 19.3 % (ref 12.0–46.0)
Lymphs Abs: 1.9 10*3/uL (ref 0.7–4.0)
MCHC: 34.6 g/dL (ref 30.0–36.0)
MCV: 88.1 fl (ref 78.0–100.0)
Monocytes Absolute: 0.4 10*3/uL (ref 0.1–1.0)
Monocytes Relative: 4.2 % (ref 3.0–12.0)
Neutro Abs: 7.3 10*3/uL (ref 1.4–7.7)
Neutrophils Relative %: 74.8 % (ref 43.0–77.0)
Platelets: 343 10*3/uL (ref 150.0–400.0)
RBC: 4.98 Mil/uL (ref 3.87–5.11)
RDW: 13.2 % (ref 11.5–15.5)
WBC: 9.8 10*3/uL (ref 4.0–10.5)

## 2017-12-24 LAB — VITAMIN D 25 HYDROXY (VIT D DEFICIENCY, FRACTURES): VITD: 33.16 ng/mL (ref 30.00–100.00)

## 2017-12-24 LAB — VITAMIN B12: Vitamin B-12: 828 pg/mL (ref 211–911)

## 2017-12-24 LAB — TSH: TSH: 1.93 u[IU]/mL (ref 0.35–4.50)

## 2017-12-24 NOTE — Progress Notes (Signed)
Lacey Burke is a 72 y.o. female is here to [place].   Patient Care Team: Briscoe Deutscher, DO as PCP - General (Family Medicine) [name], MD as Attending Physician (Obstetrics and Gynecology)   History of Present Illness:   HPI: New patient. Prefers Integrative Medicine. Recent cough, now improved. DXA: 09/05/18 due. Worried about some paresthesias. No restrictions with diet, exercises regularly.   Health Maintenance Due  Topic Date Due  . TETANUS/TDAP  11/16/2017   Depression screen PHQ 2/9 12/09/2017  Decreased Interest 0  Down, Depressed, Hopeless 0  PHQ - 2 Score 0    PMHx, SurgHx, SocialHx, Medications, and Allergies were reviewed in the Visit Navigator and updated as appropriate.   Past Medical History:  Diagnosis Date  . Anxiety   . Blood transfusion without reported diagnosis 07/15/1980   SAB at second trimester with IUD; required transfusion.  . Cataract   . GERD (gastroesophageal reflux disease)   . Heart murmur    childhood onset.  Marland Kitchen Hypertension 07/15/1998  . Osteoporosis   . Seasonal allergies    Zyrtec daily.  . Trigeminal neuralgia 07/15/1996   s/p surgical procedure 05/15/2013 [facility].     Past Surgical History:  Procedure Laterality Date  . ABDOMINAL HYSTERECTOMY  2007   Fibroids; ovaries resected.  Marland Kitchen BRAIN SURGERY  05/15/2013   surgical procedure for trigeminal neuralgia. [place]  . BREAST SURGERY     Augmentation in 1970's/Implant removal 1990's  . COLONOSCOPY  02/08/2013   normal.  Repeat in 10 years.  [name].  Marland Kitchen COSMETIC SURGERY    . EYE SURGERY    . HAND SURGERY Left august 2014  . NOSE SURGERY  07/15/1978  . Surgical procedure for trigeminal neuralgia  05/15/2013   [facility]  . TONSILECTOMY, ADENOIDECTOMY, BILATERAL MYRINGOTOMY AND TUBES       Family History  Problem Relation Age of Onset  . Obesity Mother   . Diabetes Mother   . Hypertension Mother   . Hyperlipidemia Mother   . Dementia Mother   . Diabetes Father   . Arthritis  Father        Spinal stenosis  . Irritable bowel syndrome Son   . Anxiety disorder Sister   . Arthritis Sister 50       Spinal stenosis  . Anxiety disorder Sister   . Hypertension Brother   . Hypertension Maternal Grandfather     Social History   Tobacco Use  . Smoking status: Former Smoker    Types: Cigarettes    Last attempt to quit: 07/16/1971    Years since quitting: 46.4  . Smokeless tobacco: Never Used  Substance Use Topics  . Alcohol use: Yes    Alcohol/week: 1.8 - 2.4 oz    Types: 3 - 4 Glasses of wine per week    Comment: socailly wine  . Drug use: No    Current Medications and Allergies:   Current Outpatient Medications:  .  Acetylcysteine (NAC) 500 MG CAPS, Take by mouth., Disp: , Rfl:  .  Calcium-Mag-Vit C-Vit D 185-28-2.5-200 CAPS, Take by mouth., Disp: , Rfl:  .  Coenzyme Q10 (COQ10 PO), Take 300 mg by mouth., Disp: , Rfl:  .  COLLAGEN-VITAMIN C PO, Take 2,500 mg by mouth., Disp: , Rfl:  .  escitalopram (LEXAPRO) 10 MG tablet, Take 1 tablet (10 mg total) by mouth at bedtime., Disp: 90 tablet, Rfl: 1 .  Misc Natural Products (TART CHERRY ADVANCED) CAPS, Take by mouth., Disp: , Rfl:  .  Multiple Vitamin (MULTIVITAMIN) tablet, Take by mouth., Disp: , Rfl:  .  olmesartan (BENICAR) 20 MG tablet, Take 1 tablet (20 mg total) by mouth daily. (Patient taking differently: Take 10 mg by mouth daily. ), Disp: 90 tablet, Rfl: 1 .  Omega-3 Fatty Acids (FISH OIL) 600 MG CAPS, Take by mouth., Disp: , Rfl:  .  polyethylene glycol (MIRALAX / GLYCOLAX) packet, Take 17 g by mouth daily., Disp: , Rfl:  .  TURMERIC PO, Take 200 mg by mouth., Disp: , Rfl:   Current Facility-Administered Medications:  .  EPINEPHrine (EPI-PEN) injection 0.3 mg, 0.3 mg, Intramuscular, UD, Wardell Honour, MD   Allergies  Allergen Reactions  . Bee Venom Rash and Anaphylaxis    ON LEGS  . Mobic [Meloxicam]   . Other Other (See Comments)    Other reaction: sneezing, itchy eyes   Review of  Systems:   Pertinent items are noted in the HPI. Otherwise, ROS is negative.  Vitals:   Vitals:   12/24/17 1320  BP: 140/86  Pulse: 73  Temp: 98.5 F (36.9 C)  TempSrc: Oral  SpO2: 99%  Weight: 113 lb 3.2 oz (51.3 kg)  Height: 5' 0.24" (1.53 m)     Body mass index is 21.93 kg/m.  Physical Exam:   Physical Exam  Constitutional: She is oriented to person, place, and time. She appears well-developed and well-nourished. No distress.  HENT:  Head: Normocephalic and atraumatic.  Right Ear: External ear normal.  Left Ear: External ear normal.  Nose: Nose normal.  Mouth/Throat: Oropharynx is clear and moist.  Eyes: Pupils are equal, round, and reactive to light. Conjunctivae and EOM are normal.  Neck: Normal range of motion. Neck supple. No thyromegaly present.  Cardiovascular: Normal rate, regular rhythm, normal heart sounds and intact distal pulses.  Pulmonary/Chest: Effort normal and breath sounds normal.  Abdominal: Soft. Bowel sounds are normal.  Musculoskeletal: Normal range of motion.  Lymphadenopathy:    She has no cervical adenopathy.  Neurological: She is alert and oriented to person, place, and time.  Skin: Skin is warm and dry. Capillary refill takes less than 2 seconds.  Psychiatric: She has a normal mood and affect. Her behavior is normal.  Nursing note and vitals reviewed.  Assessment and Plan:   Lacey Burke was seen today for establish care.  Diagnoses and all orders for this visit:  Paresthesias -     CBC with Differential/Platelet -     Comprehensive metabolic panel -     TSH -     Vitamin B12  Essential hypertension  Age-related osteoporosis without current pathological fracture  Vitamin D deficiency -     VITAMIN D 25 Hydroxy (Vit-D Deficiency, Fractures)   . Reviewed expectations re: course of current medical issues. . Discussed self-management of symptoms. . Outlined signs and symptoms indicating need for more acute intervention. . Patient  verbalized understanding and all questions were answered. Marland Kitchen Health Maintenance issues including appropriate healthy diet, exercise, and smoking avoidance were discussed with patient. . See orders for this visit as documented in the electronic medical record. . Patient received an After Visit Summary.  Briscoe Deutscher, DO [place] 12/29/2017

## 2017-12-29 ENCOUNTER — Encounter: Payer: Self-pay | Admitting: Family Medicine

## 2018-01-01 ENCOUNTER — Other Ambulatory Visit: Payer: Self-pay | Admitting: Physician Assistant

## 2018-01-09 ENCOUNTER — Other Ambulatory Visit: Payer: Self-pay | Admitting: Family Medicine

## 2018-01-12 ENCOUNTER — Other Ambulatory Visit: Payer: Self-pay

## 2018-01-12 NOTE — Telephone Encounter (Signed)
See note

## 2018-01-12 NOTE — Telephone Encounter (Signed)
Lexapro refill Last OV:12/24/17 Last refill:07/14/17 90 tab/1 refill by another provider DVV:[id number] Pharmacy: [place], [address] [phone] (Phone) [phone] (Fax)

## 2018-01-16 ENCOUNTER — Other Ambulatory Visit: Payer: Self-pay | Admitting: Family Medicine

## 2018-01-16 MED ORDER — EPINEPHRINE 0.3 MG/0.3ML IJ SOAJ: 0.3000 mg | Freq: Once | INTRAMUSCULAR | 1 refills | Status: AC

## 2018-01-16 NOTE — Telephone Encounter (Signed)
See note

## 2018-01-16 NOTE — Telephone Encounter (Signed)
Copied from [place] [id number]. Topic: Quick Communication - Rx Refill/Question >> Jan 16, 2018  4:44 PM [name], [name] wrote: Medication:EPINEPHrine (EPI-PEN) injection 0.3 mg     Has the patient contacted their pharmacy?yes Preferred Pharmacy (with phone number or street name): [place], [place] [phone] (Phone) [phone] (Fax)  Agent: Please be advised that RX refills may take up to 3 business days. We ask that you follow-up with your pharmacy.

## 2018-01-16 NOTE — Telephone Encounter (Signed)
Pt notified Rx for Epi pen sent to pharmacy. Pt verbalized understanding.

## 2018-03-03 ENCOUNTER — Encounter: Payer: Self-pay | Admitting: Family Medicine

## 2018-03-06 ENCOUNTER — Encounter: Payer: Self-pay | Admitting: Family Medicine

## 2018-03-06 ENCOUNTER — Encounter: Payer: Self-pay | Admitting: Gastroenterology

## 2018-03-06 ENCOUNTER — Ambulatory Visit: Payer: Medicare Other | Admitting: Family Medicine

## 2018-03-06 VITALS — BP 128/82 | HR 77 | Temp 98.2°F | Ht 60.24 in | Wt 116.4 lb

## 2018-03-06 DIAGNOSIS — Z1211 Encounter for screening for malignant neoplasm of colon: Secondary | ICD-10-CM | POA: Diagnosis not present

## 2018-03-06 DIAGNOSIS — R198 Other specified symptoms and signs involving the digestive system and abdomen: Secondary | ICD-10-CM | POA: Diagnosis not present

## 2018-03-06 DIAGNOSIS — L853 Xerosis cutis: Secondary | ICD-10-CM | POA: Diagnosis not present

## 2018-03-06 MED ORDER — TRIAMCINOLONE ACETONIDE 0.5 % EX OINT: 1.0000 "application " | TOPICAL_OINTMENT | Freq: Two times a day (BID) | CUTANEOUS | 0 refills | Status: DC

## 2018-03-06 NOTE — Progress Notes (Signed)
Lacey Burke is a 72 y.o. female is here for follow up.  History of Present Illness:   HPI: Went on cruise for vacation. Ate fairly well. Did drink ETOH. Had three days of constipation and now loose stools. No abdominal pain. No N/V, melena, BRBPR. Usually has a BM daily, taking Miralax daily.   Also, she has additional complaints of a dry patch of skin on her leg.   Health Maintenance Due  Topic Date Due  . INFLUENZA VACCINE  02/12/2018   Depression screen [facility] 2/9 12/09/2017 11/07/2017 10/06/2017  Decreased Interest 0 0 0  Down, Depressed, Hopeless 0 0 0  PHQ - 2 Score 0 0 0   PMHx, SurgHx, SocialHx, FamHx, Medications, and Allergies were reviewed in the Visit Navigator and updated as appropriate.   Patient Active Problem List   Diagnosis Date Noted  . Psychophysiological insomnia 07/29/2017  . Metatarsal stress fracture of left foot 07/25/2014  . Allergic rhinitis 09/06/2013  . Personal history of other specified conditions 05/04/2013  . Osteoporosis 02/01/2012  . Anxiety 02/01/2012  . Trigeminal neuralgia 09/10/2011  . Hypertension 09/10/2011   Social History   Tobacco Use  . Smoking status: Former Smoker    Types: Cigarettes    Last attempt to quit: 07/16/1971    Years since quitting: 46.6  . Smokeless tobacco: Never Used  Substance Use Topics  . Alcohol use: Yes    Alcohol/week: 3.0 - 4.0 standard drinks    Types: 3 - 4 Glasses of wine per week    Comment: socailly wine  . Drug use: No   Current Medications and Allergies:   .  Acetylcysteine (NAC) 500 MG CAPS, Take by mouth., Disp: , Rfl:  .  Calcium-Mag-Vit C-Vit D 185-28-2.5-200 CAPS, Take by mouth., Disp: , Rfl:  .  Coenzyme Q10 (COQ10 PO), Take 300 mg by mouth., Disp: , Rfl:  .  COLLAGEN-VITAMIN C PO, Take 2,500 mg by mouth., Disp: , Rfl:  .  escitalopram (LEXAPRO) 10 MG tablet, TAKE 1 TABLET BY MOUTH AT BEDTIME, Disp: 90 tablet, Rfl: 1 .  Misc Natural Products (TART CHERRY ADVANCED) CAPS, Take by mouth.,  Disp: , Rfl:  .  Multiple Vitamin (MULTIVITAMIN) tablet, Take by mouth., Disp: , Rfl:  .  olmesartan (BENICAR) 20 MG tablet, Take 1 tablet (20 mg total) by mouth daily. (Patient taking differently: Take 10 mg by mouth daily. ), Disp: 90 tablet, Rfl: 1 .  Omega-3 Fatty Acids (FISH OIL) 600 MG CAPS, Take by mouth., Disp: , Rfl:  .  polyethylene glycol (MIRALAX / GLYCOLAX) packet, Take 17 g by mouth daily., Disp: , Rfl:  .  TURMERIC PO, Take 200 mg by mouth., Disp: , Rfl:     Allergies  Allergen Reactions  . Bee Venom Rash and Anaphylaxis    ON LEGS  . Mobic [Meloxicam]   . Other Other (See Comments)    Other reaction: sneezing, itchy eyes   Review of Systems   Pertinent items are noted in the HPI. Otherwise, ROS is negative.  Vitals:   Vitals:   03/06/18 1423  BP: 128/82  Pulse: 77  Temp: 98.2 F (36.8 C)  TempSrc: Oral  SpO2: 98%  Weight: 116 lb 6.4 oz (52.8 kg)  Height: 5' 0.24" (1.53 m)     Body mass index is 22.55 kg/m.  Physical Exam:   Physical Exam  Constitutional: She appears well-nourished.  HENT:  Head: Normocephalic and atraumatic.  Eyes: Pupils are equal, round, and reactive  to light. EOM are normal.  Neck: Normal range of motion. Neck supple.  Cardiovascular: Normal rate, regular rhythm, normal heart sounds and intact distal pulses.  Pulmonary/Chest: Effort normal.  Abdominal: Soft.  Skin: Skin is warm.  Psychiatric: She has a normal mood and affect. Her behavior is normal.  Nursing note and vitals reviewed.   Assessment and Plan:   [name] was seen today for constipation.  Diagnoses and all orders for this visit:  Change in bowel movement Comments: No red flags. Recommend hydration, probiotics, and monitoring. Due for colonoscopy soon.   Screening for colon cancer -     Ambulatory referral to Gastroenterology  Dry skin -     triamcinolone ointment (KENALOG) 0.5 %; Apply 1 application topically 2 (two) times daily.    . Reviewed  expectations re: course of current medical issues. . Discussed self-management of symptoms. . Outlined signs and symptoms indicating need for more acute intervention. . Patient verbalized understanding and all questions were answered. Marland Kitchen Health Maintenance issues including appropriate healthy diet, exercise, and smoking avoidance were discussed with patient. . See orders for this visit as documented in the electronic medical record. . Patient received an After Visit Summary.  Briscoe Deutscher, DO [place], [facility] 03/07/2018

## 2018-03-07 ENCOUNTER — Encounter: Payer: Self-pay | Admitting: Family Medicine

## 2018-03-27 ENCOUNTER — Ambulatory Visit: Payer: Medicare Other | Admitting: Family Medicine

## 2018-03-27 ENCOUNTER — Encounter: Payer: Self-pay | Admitting: Family Medicine

## 2018-03-27 VITALS — BP 122/88 | HR 61 | Temp 98.1°F | Ht 60.24 in | Wt 116.2 lb

## 2018-03-27 DIAGNOSIS — R918 Other nonspecific abnormal finding of lung field: Secondary | ICD-10-CM | POA: Diagnosis not present

## 2018-03-27 DIAGNOSIS — Z13220 Encounter for screening for lipoid disorders: Secondary | ICD-10-CM | POA: Diagnosis not present

## 2018-03-27 DIAGNOSIS — I10 Essential (primary) hypertension: Secondary | ICD-10-CM | POA: Diagnosis not present

## 2018-03-27 LAB — LIPID PANEL
Cholesterol: 192 mg/dL (ref 0–200)
HDL: 81.8 mg/dL (ref >39.00)
LDL Cholesterol: 95 mg/dL (ref 0–99)
NonHDL: 110.1
Total CHOL/HDL Ratio: 2
Triglycerides: 74 mg/dL (ref 0.0–149.0)
VLDL: 14.8 mg/dL (ref 0.0–40.0)

## 2018-03-27 LAB — COMPREHENSIVE METABOLIC PANEL
ALT: 20 U/L (ref 0–35)
AST: 23 U/L (ref 0–37)
Albumin: 4.4 g/dL (ref 3.5–5.2)
Alkaline Phosphatase: 67 U/L (ref 39–117)
BUN: 23 mg/dL (ref 6–23)
CO2: 25 mEq/L (ref 19–32)
Calcium: 10 mg/dL (ref 8.4–10.5)
Chloride: 103 mEq/L (ref 96–112)
Creatinine, Ser: 0.66 mg/dL (ref 0.40–1.20)
GFR: 93.74 mL/min (ref >60.00)
Glucose, Bld: 87 mg/dL (ref 70–99)
Potassium: 4.4 mEq/L (ref 3.5–5.1)
Sodium: 137 mEq/L (ref 135–145)
Total Bilirubin: 0.5 mg/dL (ref 0.2–1.2)
Total Protein: 7.1 g/dL (ref 6.0–8.3)

## 2018-03-27 NOTE — Progress Notes (Signed)
Lacey Burke is a 72 y.o. female is here for follow up.  History of Present Illness:   Lacey Burke CMA acting as scribe for Dr. Juleen Burke.  HPI:  Patient comes in today for her follow up.  Pulmonary Nodules: Patient had a CT scan in 2017. Patient would like to know if she should go and have another CT scan. She was supposed to have 3-6 months after the first CT, but she canceled. She is concerned about the exposure to the radiation. I explained to the patient that to determine if the nodules have grown we would need to get the CT scan. Dr. Juleen Burke explained to the patient that if it is stable at this time we would not need to repeat.    Review of Systems  Constitutional: Negative for chills and fever.  HENT: Negative for congestion and ear pain.   Eyes: Negative for blurred vision and double vision.  Respiratory: Negative for cough and shortness of breath.   Cardiovascular: Negative for chest pain and leg swelling.  Gastrointestinal: Negative for nausea and vomiting.  Genitourinary: Negative for dysuria and hematuria.  Musculoskeletal: Negative for back pain and neck pain.  Neurological: Negative for dizziness and tingling.  Psychiatric/Behavioral: Negative for depression and suicidal ideas.    There are no preventive care reminders to display for this patient. Depression screen [facility] 2/9 12/09/2017 11/07/2017 10/06/2017  Decreased Interest 0 0 0  Down, Depressed, Hopeless 0 0 0  PHQ - 2 Score 0 0 0   PMHx, SurgHx, SocialHx, FamHx, Medications, and Allergies were reviewed in the Visit Navigator and updated as appropriate.   Patient Active Problem List   Diagnosis Date Noted  . Psychophysiological insomnia 07/29/2017  . Metatarsal stress fracture of left foot 07/25/2014  . Allergic rhinitis 09/06/2013  . Personal history of other specified conditions 05/04/2013  . Osteoporosis 02/01/2012  . Anxiety 02/01/2012  . Trigeminal neuralgia 09/10/2011  . Hypertension 09/10/2011    Social History   Tobacco Use  . Smoking status: Former Smoker    Types: Cigarettes    Last attempt to quit: 07/16/1971    Years since quitting: 46.7  . Smokeless tobacco: Never Used  Substance Use Topics  . Alcohol use: Yes    Alcohol/week: 3.0 - 4.0 standard drinks    Types: 3 - 4 Glasses of wine per week    Comment: socailly wine  . Drug use: No   Current Medications and Allergies:   Current Outpatient Medications:  .  Acetylcysteine (NAC) 500 MG CAPS, Take by mouth., Disp: , Rfl:  .  Calcium-Mag-Vit C-Vit D 185-28-2.5-200 CAPS, Take by mouth., Disp: , Rfl:  .  Coenzyme Q10 (COQ10 PO), Take 300 mg by mouth., Disp: , Rfl:  .  COLLAGEN-VITAMIN C PO, Take 2,500 mg by mouth., Disp: , Rfl:  .  escitalopram (LEXAPRO) 10 MG tablet, TAKE 1 TABLET BY MOUTH AT BEDTIME, Disp: 90 tablet, Rfl: 1 .  Misc Natural Products (TART CHERRY ADVANCED) CAPS, Take by mouth., Disp: , Rfl:  .  Multiple Vitamin (MULTIVITAMIN) tablet, Take by mouth., Disp: , Rfl:  .  olmesartan (BENICAR) 20 MG tablet, Take 1 tablet (20 mg total) by mouth daily. (Patient taking differently: Take 10 mg by mouth daily. ), Disp: 90 tablet, Rfl: 1 .  Omega-3 Fatty Acids (FISH OIL) 600 MG CAPS, Take by mouth., Disp: , Rfl:  .  polyethylene glycol (MIRALAX / GLYCOLAX) packet, Take 17 g by mouth daily., Disp: , Rfl:  .  triamcinolone ointment (KENALOG) 0.5 %, Apply 1 application topically 2 (two) times daily., Disp: 30 g, Rfl: 0 .  TURMERIC PO, Take 200 mg by mouth., Disp: , Rfl:    Allergies  Allergen Reactions  . Bee Venom Rash and Anaphylaxis    ON LEGS  . Mobic [Meloxicam]   . Other Other (See Comments)    Other reaction: sneezing, itchy eyes   Review of Systems   Pertinent items are noted in the HPI. Otherwise, ROS is negative.  Vitals:   Vitals:   03/27/18 1332  BP: 122/88  Pulse: 61  Temp: 98.1 F (36.7 C)  TempSrc: Oral  SpO2: 99%  Weight: 116 lb 3.2 oz (52.7 kg)  Height: 5' 0.24" (1.53 m)     Body  mass index is 22.51 kg/m.  Physical Exam:   Physical Exam  Constitutional: She appears well-nourished.  HENT:  Head: Normocephalic and atraumatic.  Eyes: Pupils are equal, round, and reactive to light. EOM are normal.  Neck: Normal range of motion. Neck supple.  Cardiovascular: Normal rate, regular rhythm, normal heart sounds and intact distal pulses.  Pulmonary/Chest: Effort normal.  Abdominal: Soft.  Skin: Skin is warm.  Psychiatric: She has a normal mood and affect. Her behavior is normal.  Nursing note and vitals reviewed.  Assessment and Plan:   Lacey Burke was seen today for follow-up.  Diagnoses and all orders for this visit:  Essential hypertension Comments: Due for lab. Continue current treatment.  Orders: -     Comprehensive metabolic panel  Pulmonary nodules Comments: Patient agrees to repeat CT. Orders: -     CT Chest Wo Contrast; Future  Lipid screening -     Lipid panel    . Reviewed expectations re: course of current medical issues. . Discussed self-management of symptoms. . Outlined signs and symptoms indicating need for more acute intervention. . Patient verbalized understanding and all questions were answered. Lacey Burke Health Maintenance issues including appropriate healthy diet, exercise, and smoking avoidance were discussed with patient. . See orders for this visit as documented in the electronic medical record. . Patient received an After Visit Summary.  CMA served as Education administrator during this visit. History, Physical, and Plan performed by medical provider. The above documentation has been reviewed and is accurate and complete. Lacey Burke, D.O.  Lacey Deutscher, DO [place], [facility] 04/05/2018

## 2018-04-01 ENCOUNTER — Encounter: Payer: Self-pay | Admitting: Family Medicine

## 2018-04-05 ENCOUNTER — Encounter: Payer: Self-pay | Admitting: Family Medicine

## 2018-04-10 ENCOUNTER — Ambulatory Visit (INDEPENDENT_AMBULATORY_CARE_PROVIDER_SITE_OTHER)
Admission: RE | Admit: 2018-04-10 | Discharge: 2018-04-10 | Disposition: A | Discharge status: Home / Self Care | Payer: Medicare Other | Source: Ambulatory Visit | Attending: Family Medicine | Admitting: Family Medicine

## 2018-04-10 DIAGNOSIS — R918 Other nonspecific abnormal finding of lung field: Secondary | ICD-10-CM

## 2018-04-20 ENCOUNTER — Encounter: Payer: Self-pay | Admitting: Family Medicine

## 2018-04-22 ENCOUNTER — Encounter: Payer: Self-pay | Admitting: Family Medicine

## 2018-04-26 NOTE — Progress Notes (Signed)
Lacey Burke is a 72 y.o. female is here for follow up.  History of Present Illness:   HPI:   Dr. Juleen China,   I have had difficulty getting adequate sleep for a long while. I had been on Trazadone some time ago but it never really helped. I wake about 3-4 hours after falling asleep and it takes no less than an hour (usually more) to fall back to sleep. I wake up more than once during the night. I've tried melatonin and other homeopathic sleep aids that do not work for me. While I'd rather not take any more medicine, I'm willing to take something to get adequate, good quality sleep. If there is something effective with few side affects, I would appreciate you prescribing something for me to try.   Thanks.  [place] Maintenance Due  Topic Date Due  . PNA vac Low Risk Adult (2 of 2 - PCV13) 04/09/2018   Depression screen [facility] 2/9 12/09/2017 11/07/2017 10/06/2017  Decreased Interest 0 0 0  Down, Depressed, Hopeless 0 0 0  PHQ - 2 Score 0 0 0   PMHx, SurgHx, SocialHx, FamHx, Medications, and Allergies were reviewed in the Visit Navigator and updated as appropriate.   Patient Active Problem List   Diagnosis Date Noted  . Psychophysiological insomnia 07/29/2017  . Metatarsal stress fracture of left foot 07/25/2014  . Allergic rhinitis 09/06/2013  . Personal history of other specified conditions 05/04/2013  . Osteoporosis 02/01/2012  . Anxiety 02/01/2012  . Trigeminal neuralgia 09/10/2011  . Hypertension 09/10/2011   Social History   Tobacco Use  . Smoking status: Former Smoker    Types: Cigarettes    Last attempt to quit: 07/16/1971    Years since quitting: 46.8  . Smokeless tobacco: Never Used  Substance Use Topics  . Alcohol use: Yes    Alcohol/week: 3.0 - 4.0 standard drinks    Types: 3 - 4 Glasses of wine per week    Comment: socailly wine  . Drug use: No   Current Medications and Allergies:   .  Acetylcysteine (NAC) 500 MG CAPS, Take by mouth., Disp: , Rfl:    .  Calcium-Mag-Vit C-Vit D 185-28-2.5-200 CAPS, Take by mouth., Disp: , Rfl:  .  Coenzyme Q10 (COQ10 PO), Take 300 mg by mouth., Disp: , Rfl:  .  COLLAGEN-VITAMIN C PO, Take 2,500 mg by mouth., Disp: , Rfl:  .  escitalopram (LEXAPRO) 10 MG tablet, TAKE 1 TABLET BY MOUTH AT BEDTIME, Disp: 90 tablet, Rfl: 1 .  Misc Natural Products (TART CHERRY ADVANCED) CAPS, Take by mouth., Disp: , Rfl:  .  Multiple Vitamin (MULTIVITAMIN) tablet, Take by mouth., Disp: , Rfl:  .  olmesartan (BENICAR) 20 MG tablet, Take 1 tablet (20 mg total) by mouth daily. (Patient taking differently: Take 10 mg by mouth daily. ), Disp: 90 tablet, Rfl: 1 .  Omega-3 Fatty Acids (FISH OIL) 600 MG CAPS, Take by mouth., Disp: , Rfl:  .  polyethylene glycol (MIRALAX / GLYCOLAX) packet, Take 17 g by mouth daily., Disp: , Rfl:  .  triamcinolone ointment (KENALOG) 0.5 %, Apply 1 application topically 2 (two) times daily., Disp: 30 g, Rfl: 0 .  TURMERIC PO, Take 200 mg by mouth., Disp: , Rfl:     Allergies  Allergen Reactions  . Bee Venom Rash and Anaphylaxis    ON LEGS  . Mobic [Meloxicam]   . Other Other (See Comments)    Other reaction: sneezing, itchy  eyes   Review of Systems   Pertinent items are noted in the HPI. Otherwise, ROS is negative.  Vitals:   Vitals:   04/27/18 1327  BP: 112/68  Pulse: 64  Temp: 98.1 F (36.7 C)  TempSrc: Oral  SpO2: 98%  Weight: 116 lb (52.6 kg)  Height: 5' 0.24" (1.53 m)     Body mass index is 22.47 kg/m.  Physical Exam:   Physical Exam  Constitutional: She appears well-nourished.  HENT:  Head: Normocephalic and atraumatic.  Eyes: Pupils are equal, round, and reactive to light. EOM are normal.  Neck: Normal range of motion. Neck supple.  Cardiovascular: Normal rate, regular rhythm, normal heart sounds and intact distal pulses.  Pulmonary/Chest: Effort normal.  Abdominal: Soft.  Skin: Skin is warm.  Psychiatric: She has a normal mood and affect. Her behavior is normal.   Nursing note and vitals reviewed.  Assessment and Plan:   [name] was seen today for insomnia.  Diagnoses and all orders for this visit:  Psychophysiological insomnia Comments: After discussion, patient would like to start below medication. Expectations, risks, and potential side effects reviewed.   Discussed sleep hygiene measures including regular sleep schedule, optimal sleep environment, and relaxing presleep rituals. Avoid daytime naps. Avoid caffeine after noon. Avoid excess alcohol. Avoid tobacco. Recommended daily exercise. Short course of benzodiazepine therapy.  Orders: -     LORazepam (ATIVAN) 1 MG tablet; Take 1 tablet (1 mg total) by mouth at bedtime.  . Reviewed expectations re: course of current medical issues. . Discussed self-management of symptoms. . Outlined signs and symptoms indicating need for more acute intervention. . Patient verbalized understanding and all questions were answered. Marland Kitchen Health Maintenance issues including appropriate healthy diet, exercise, and smoking avoidance were discussed with patient. . See orders for this visit as documented in the electronic medical record. . Patient received an After Visit Summary.  Briscoe Deutscher, DO [place], [facility] 04/28/2018

## 2018-04-27 ENCOUNTER — Ambulatory Visit: Payer: Medicare Other | Admitting: Family Medicine

## 2018-04-27 ENCOUNTER — Encounter: Payer: Self-pay | Admitting: Family Medicine

## 2018-04-27 VITALS — BP 112/68 | HR 64 | Temp 98.1°F | Ht 60.24 in | Wt 116.0 lb

## 2018-04-27 DIAGNOSIS — F5104 Psychophysiologic insomnia: Secondary | ICD-10-CM | POA: Diagnosis not present

## 2018-04-27 MED ORDER — LORAZEPAM 1 MG PO TABS: 1.0000 mg | ORAL_TABLET | Freq: Every day | ORAL | 3 refills | Status: DC

## 2018-04-28 ENCOUNTER — Encounter: Payer: Self-pay | Admitting: Family Medicine

## 2018-05-04 ENCOUNTER — Encounter: Payer: Medicare Other | Admitting: Gastroenterology

## 2018-05-28 NOTE — Progress Notes (Signed)
Lacey Burke is a 72 y.o. female here for an acute visit.  History of Present Illness:   Lacey Burke, CMA acting as scribe for Dr. Briscoe Deutscher.   HPI: Surgical site on head raised. Area behind right earl feels raised and can feel pulling when she bends over. She hs not had any redness or tenderness to the touch. Thinks that it started about three weeks ago.   PMHx, SurgHx, SocialHx, Medications, and Allergies were reviewed in the Visit Navigator and updated as appropriate.  Current Medications:   .  Acetylcysteine (NAC) 500 MG CAPS, Take by mouth., Disp: , Rfl:  .  Calcium-Mag-Vit C-Vit D 185-28-2.5-200 CAPS, Take by mouth., Disp: , Rfl:  .  Coenzyme Q10 (COQ10 PO), Take 300 mg by mouth., Disp: , Rfl:  .  COLLAGEN-VITAMIN C PO, Take 2,500 mg by mouth., Disp: , Rfl:  .  escitalopram (LEXAPRO) 10 MG tablet, TAKE 1 TABLET BY MOUTH AT BEDTIME, Disp: 90 tablet, Rfl: 1 .  LORazepam (ATIVAN) 1 MG tablet, Take 1 tablet (1 mg total) by mouth at bedtime., Disp: 30 tablet, Rfl: 3 .  Misc Natural Products (TART CHERRY ADVANCED) CAPS, Take by mouth., Disp: , Rfl:  .  Multiple Vitamin (MULTIVITAMIN) tablet, Take by mouth., Disp: , Rfl:  .  olmesartan (BENICAR) 20 MG tablet, Take 1 tablet (20 mg total) by mouth daily. (Patient taking differently: Take 10 mg by mouth daily. ), Disp: 90 tablet, Rfl: 1 .  Omega-3 Fatty Acids (FISH OIL) 600 MG CAPS, Take by mouth., Disp: , Rfl:  .  polyethylene glycol (MIRALAX / GLYCOLAX) packet, Take 17 g by mouth daily., Disp: , Rfl:  .  triamcinolone ointment (KENALOG) 0.5 %, Apply 1 application topically 2 (two) times daily., Disp: 30 g, Rfl: 0 .  TURMERIC PO, Take 200 mg by mouth., Disp: , Rfl:   Allergies  Allergen Reactions  . Bee Venom Rash and Anaphylaxis    ON LEGS  . Mobic [Meloxicam]   . Other Other (See Comments)    Other reaction: sneezing, itchy eyes   Review of Systems:   Pertinent items are noted in the HPI. Otherwise, ROS is  negative.  Vitals:   Vitals:   05/29/18 0927  BP: (!) 146/80  Pulse: 69  Temp: 98.1 F (36.7 C)  TempSrc: Oral  SpO2: 97%  Weight: 118 lb 6.4 oz (53.7 kg)  Height: 5' 0.24" (1.53 m)     Body mass index is 22.94 kg/m.  Physical Exam:   Physical Exam  Constitutional: She appears well-nourished.  HENT:  Head: Normocephalic and atraumatic.  Eyes: Pupils are equal, round, and reactive to light. EOM are normal.  Neck: Normal range of motion. Neck supple.  Cardiovascular: Normal rate, regular rhythm, normal heart sounds and intact distal pulses.  Pulmonary/Chest: Effort normal.  Abdominal: Soft.  Skin: Skin is warm.  Healed incision site with no evidence of infection or edema. Just medial to area, possible soft tissue swelling v anatomic variance.  Psychiatric: She has a normal mood and affect. Her behavior is normal.  Nursing note and vitals reviewed.   Assessment and Plan:   [name] was seen today for follow-up.  Diagnoses and all orders for this visit:  Superficial swelling of scalp Comments: Without pain or skin changes. No systemic or focal symptoms. Xray reassuring.  Orders: -     DG Skull Complete  Essential hypertension -     olmesartan (BENICAR) 20 MG tablet; Take 0.5 tablets (10 mg  total) by mouth daily.  Anxiety -     escitalopram (LEXAPRO) 10 MG tablet; Take 1 tablet (10 mg total) by mouth at bedtime.  Chronic idiopathic constipation  Vitamin D deficiency  B12 deficiency    . Reviewed expectations re: course of current medical issues. . Discussed self-management of symptoms. . Outlined signs and symptoms indicating need for more acute intervention. . Patient verbalized understanding and all questions were answered. Marland Kitchen Health Maintenance issues including appropriate healthy diet, exercise, and smoking avoidance were discussed with patient. . See orders for this visit as documented in the electronic medical record. . Patient received an After Visit  Summary.  CMA served as Education administrator during this visit. History, Physical, and Plan performed by medical provider. The above documentation has been reviewed and is accurate and complete. Briscoe Deutscher, D.O.   Briscoe Deutscher, DO [place], [facility] 05/30/2018

## 2018-05-29 ENCOUNTER — Encounter: Payer: Self-pay | Admitting: Family Medicine

## 2018-05-29 ENCOUNTER — Ambulatory Visit: Payer: Medicare Other | Admitting: Family Medicine

## 2018-05-29 ENCOUNTER — Ambulatory Visit (INDEPENDENT_AMBULATORY_CARE_PROVIDER_SITE_OTHER): Payer: Medicare Other

## 2018-05-29 VITALS — BP 146/80 | HR 69 | Temp 98.1°F | Ht 60.24 in | Wt 118.4 lb

## 2018-05-29 DIAGNOSIS — R220 Localized swelling, mass and lump, head: Secondary | ICD-10-CM | POA: Diagnosis not present

## 2018-05-29 DIAGNOSIS — E538 Deficiency of other specified B group vitamins: Secondary | ICD-10-CM

## 2018-05-29 DIAGNOSIS — F419 Anxiety disorder, unspecified: Secondary | ICD-10-CM | POA: Diagnosis not present

## 2018-05-29 DIAGNOSIS — K5904 Chronic idiopathic constipation: Secondary | ICD-10-CM

## 2018-05-29 DIAGNOSIS — E559 Vitamin D deficiency, unspecified: Secondary | ICD-10-CM

## 2018-05-29 DIAGNOSIS — I10 Essential (primary) hypertension: Secondary | ICD-10-CM

## 2018-05-29 MED ORDER — OLMESARTAN MEDOXOMIL 20 MG PO TABS: 10.0000 mg | ORAL_TABLET | Freq: Every day | ORAL | 3 refills | Status: DC

## 2018-05-29 MED ORDER — ESCITALOPRAM OXALATE 10 MG PO TABS: 10.0000 mg | ORAL_TABLET | Freq: Every day | ORAL | 1 refills | Status: DC

## 2018-05-30 ENCOUNTER — Encounter: Payer: Self-pay | Admitting: Family Medicine

## 2018-06-08 ENCOUNTER — Encounter: Payer: Self-pay | Admitting: Family Medicine

## 2018-06-08 DIAGNOSIS — G500 Trigeminal neuralgia: Secondary | ICD-10-CM

## 2018-06-09 ENCOUNTER — Encounter: Payer: Self-pay | Admitting: Neurology

## 2018-06-16 NOTE — Progress Notes (Signed)
NEUROLOGY CONSULTATION NOTE  Lacey Burke MRN: [id number] DOB: Mar 22, 1946  Referring provider: Briscoe Deutscher, DO Primary care provider: Briscoe Deutscher, DO  Reason for consult:  Suboccipital swelling  HISTORY OF PRESENT ILLNESS: Lacey Burke is a 72 year old female with history of right-sided trigeminal neuralgia who presents for right sided suboccipital swelling and neck tightness.  History supplemented by prior neurologist's notes.  She was diagnosed with right sided trigeminal neuralgia in the 1990s, described as sharp shooting pain.  She had microvascular decompression surgery in 2014 years ago.  Several weeks ago, she noticed swelling and nodular areas in the right suboccipital region and behind the ear.  It is not tender to touch.  No pain, numbness or tingling sensation.  No radicular pain, numbness or weakness down the right arm.  Sometimes the right side of her neck feels tight.  Bending or turning her neck may cause a pulling sensation.  However, there is no shooting pain.  She is concerned that it may be residual from her prior surgery.  MRI of brain with and without contrast from 03/13/08 personally reviewed and demonstrated a small blood vessel crossing the right cranial nerve five nerve root entry zone, but otherwise unremarkable.   CMP from September was normal.  PAST MEDICAL HISTORY: Past Medical History:  Diagnosis Date  . Anxiety   . Blood transfusion without reported diagnosis 07/15/1980   SAB at second trimester with IUD; required transfusion.  . Cataract   . GERD (gastroesophageal reflux disease)   . Heart murmur    childhood onset.  Marland Kitchen Hypertension 07/15/1998  . Osteoporosis   . Seasonal allergies    Zyrtec daily.  . Trigeminal neuralgia 07/15/1996   s/p surgical procedure 05/15/2013 [facility].    PAST SURGICAL HISTORY: Past Surgical History:  Procedure Laterality Date  . ABDOMINAL HYSTERECTOMY  2007   Fibroids; ovaries resected.  Marland Kitchen BRAIN SURGERY  05/15/2013   surgical procedure for trigeminal neuralgia. [place]  . BREAST SURGERY     Augmentation in 1970's/Implant removal 1990's  . COLONOSCOPY  02/08/2013   normal.  Repeat in 10 years.  [name].  Marland Kitchen COSMETIC SURGERY    . EYE SURGERY    . HAND SURGERY Left august 2014  . NOSE SURGERY  07/15/1978  . Surgical procedure for trigeminal neuralgia  05/15/2013   [facility]  . TONSILECTOMY, ADENOIDECTOMY, BILATERAL MYRINGOTOMY AND TUBES      MEDICATIONS: Current Outpatient Medications on File Prior to Visit  Medication Sig Dispense Refill  . Acetylcysteine (NAC) 500 MG CAPS Take by mouth.    . B Complex Vitamins (B COMPLEX 50) TABS     . Calcium-Mag-Vit C-Vit D 185-28-2.5-200 CAPS Take by mouth.    . Cholecalciferol (D3-1000) 25 MCG (1000 UT) capsule     . Coenzyme Q10 (COQ10 PO) Take 300 mg by mouth.    . COLLAGEN-VITAMIN C PO Take 2,500 mg by mouth.    . escitalopram (LEXAPRO) 10 MG tablet Take 1 tablet (10 mg total) by mouth at bedtime. 90 tablet 1  . LORazepam (ATIVAN) 1 MG tablet Take 1 tablet (1 mg total) by mouth at bedtime. 30 tablet 3  . Misc Natural Products (RESVERATROL ULTRA) CAPS     . Misc Natural Products (TART CHERRY ADVANCED) CAPS Take by mouth.    . olmesartan (BENICAR) 20 MG tablet Take 0.5 tablets (10 mg total) by mouth daily. 90 tablet 3  . Omega-3 Fatty Acids (FISH OIL) 600 MG CAPS Take by mouth.    Marland Kitchen  polyethylene glycol (MIRALAX / GLYCOLAX) packet Take 17 g by mouth daily.    . Psyllium (METAMUCIL FREE & NATURAL PO)     . triamcinolone ointment (KENALOG) 0.5 % Apply 1 application topically 2 (two) times daily. 30 g 0  . TURMERIC PO Take 200 mg by mouth.     Current Facility-Administered Medications on File Prior to Visit  Medication Dose Route Frequency Provider Last Rate Last Dose  . EPINEPHrine (EPI-PEN) injection 0.3 mg  0.3 mg Intramuscular UD Wardell Honour, MD        ALLERGIES: Allergies  Allergen Reactions  . Bee Venom Rash and Anaphylaxis    ON LEGS  . Mobic  [Meloxicam]   . Other Other (See Comments)    Other reaction: sneezing, itchy eyes    FAMILY HISTORY: Family History  Problem Relation Age of Onset  . Obesity Mother   . Diabetes Mother   . Hypertension Mother   . Hyperlipidemia Mother   . Dementia Mother   . Diabetes Father   . Arthritis Father        Spinal stenosis  . Irritable bowel syndrome Son   . Anxiety disorder Sister   . Arthritis Sister 50       Spinal stenosis  . Anxiety disorder Sister   . Hypertension Brother   . Hypertension Maternal Grandfather     SOCIAL HISTORY: Social History   Socioeconomic History  . Marital status: Divorced    Spouse name: Not on file  . Number of children: 2  . Years of education: Not on file  . Highest education level: Not on file  Occupational History  . Occupation: retired    Fish farm manager: [employer]    Comment: Pharmacist, hospital  . 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 Financial resource strain: Not on file  . Food insecurity:    Worry: Not on file    Inability: Not on file  . Transportation needs:    Medical: Not on file    Non-medical: Not on file  Tobacco Use  . Smoking status: Former Smoker    Types: Cigarettes    Last attempt to quit: 07/16/1971    Years since quitting: 46.9  . Smokeless tobacco: Never Used  Substance and Sexual Activity  . Alcohol use: Yes    Alcohol/week: 3.0 - 4.0 standard drinks    Types: 3 - 4 Glasses of wine per week    Comment: socailly wine  . Drug use: No  . Sexual activity: Never    Birth control/protection: Post-menopausal  Lifestyle  . Physical activity:    Days per week: Not on file    Minutes per session: Not on file  . Stress: Not on file  Relationships  . Social connections:    Talks on phone: Not on file    Gets together: Not on file    Attends religious service: Not on file    Active member of club or organization: Not on file    Attends meetings of clubs or  organizations: Not on file    Relationship status: Not on file  . Intimate partner violence:    Fear of current or ex partner: Not on file    Emotionally abused: Not on file    Physically abused: Not on file    Forced sexual activity: Not on file  Other Topics Concern  . Not on file  Social History  Narrative   Marital: divorced since 1994 after 20 years of marriage; +dating since 04/2014.       Children: 2 children/sons (38, 30); no grandchildren.  One on the way in 11/2014.      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      Tobacco:  Former smoker; quit in college.      Alcohol:  Socially wine 3 nights per week.      Drugs: none      Exercise:  Three days per week; walking/running/weights. Computer programs stengthening.      ADLs: independent with all ADLs; drives.    REVIEW OF SYSTEMS: Constitutional: No fevers, chills, or sweats, no generalized fatigue, change in appetite Eyes: No visual changes, double vision, eye pain Ear, nose and throat: No hearing loss, ear pain, nasal congestion, sore throat Cardiovascular: No chest pain, palpitations Respiratory:  No shortness of breath at rest or with exertion, wheezes GastrointestinaI: No nausea, vomiting, diarrhea, abdominal pain, fecal incontinence Genitourinary:  No dysuria, urinary retention or frequency Musculoskeletal:  No neck pain, back pain Integumentary: No rash, pruritus, skin lesions Neurological: as above Psychiatric: No depression, insomnia, anxiety Endocrine: No palpitations, fatigue, diaphoresis, mood swings, change in appetite, change in weight, increased thirst Hematologic/Lymphatic:  No purpura, petechiae. Allergic/Immunologic: no itchy/runny eyes, nasal congestion, recent allergic reactions, rashes  PHYSICAL EXAM: Blood pressure (!) 144/90, pulse 77, height 5' (1.524  m), weight 119 lb (54 kg), SpO2 97 %. General: No acute distress.  Patient appears well-groomed.   Head:  Normocephalic/atraumatic.   Eyes:  fundi examined but not visualized Neck: supple, no paraspinal tenderness, full range of motion Back: No paraspinal tenderness Heart: regular rate and rhythm Lungs: Clear to auscultation bilaterally. Vascular: No carotid bruits. Neurological Exam: Mental status: alert and oriented to person, place, and time, recent and remote memory intact, fund of knowledge intact, attention and concentration intact, speech fluent and not dysarthric, language intact. Cranial nerves: CN I: not tested CN II: pupils equal, round and reactive to light, visual fields intact CN III, IV, VI:  full range of motion, no nystagmus, no ptosis CN V: facial sensation intact CN VII: upper and lower face symmetric CN VIII: hearing intact CN IX, X: gag intact, uvula midline CN XI: sternocleidomastoid and trapezius muscles intact CN XII: tongue midline Bulk & Tone: normal, no fasciculations. Motor:  5/5 throughout  Sensation:  temperature and vibration sensation intact. Deep Tendon Reflexes:  2+ throughout, toes downgoing.   Finger to nose testing:  Without dysmetria.   Heel to shin:  Without dysmetria.   Gait:  Normal station and stride.  Romberg negative.  IMPRESSION: The bumps at the back of her ear are bone.  The neck tightness is probably myofascial in etiology.  I do not think it is related to her prior surgery.  Thank you for allowing me to take part in the care of this patient.  Metta Clines, DO  CC: Briscoe Deutscher, DO

## 2018-06-17 ENCOUNTER — Ambulatory Visit: Payer: Medicare Other | Admitting: Neurology

## 2018-06-17 ENCOUNTER — Encounter: Payer: Self-pay | Admitting: Neurology

## 2018-06-17 VITALS — BP 144/90 | HR 77 | Ht 60.0 in | Wt 119.0 lb

## 2018-06-17 DIAGNOSIS — G500 Trigeminal neuralgia: Secondary | ICD-10-CM | POA: Diagnosis not present

## 2018-06-17 DIAGNOSIS — R29898 Other symptoms and signs involving the musculoskeletal system: Secondary | ICD-10-CM

## 2018-06-17 NOTE — Patient Instructions (Signed)
I think the bumps behind the ear is bone.  The swelling and pulling sensation of the neck is muscle tightness.  I don't suspect anything related to your prior surgery.

## 2018-08-18 ENCOUNTER — Encounter

## 2018-08-18 ENCOUNTER — Ambulatory Visit: Payer: Medicare Other | Admitting: Neurology

## 2018-09-09 ENCOUNTER — Ambulatory Visit: Payer: Medicare Other | Admitting: Family Medicine

## 2018-09-09 ENCOUNTER — Encounter: Payer: Self-pay | Admitting: Family Medicine

## 2018-09-09 ENCOUNTER — Ambulatory Visit (INDEPENDENT_AMBULATORY_CARE_PROVIDER_SITE_OTHER): Payer: Medicare Other

## 2018-09-09 VITALS — BP 136/88 | HR 68 | Temp 98.6°F | Ht 60.0 in | Wt 117.4 lb

## 2018-09-09 DIAGNOSIS — R918 Other nonspecific abnormal finding of lung field: Secondary | ICD-10-CM

## 2018-09-09 DIAGNOSIS — R059 Cough, unspecified: Secondary | ICD-10-CM

## 2018-09-09 DIAGNOSIS — R05 Cough: Secondary | ICD-10-CM

## 2018-09-09 MED ORDER — AMLODIPINE BESYLATE 5 MG PO TABS: 5.0000 mg | ORAL_TABLET | Freq: Every day | ORAL | 2 refills | Status: DC

## 2018-09-09 MED ORDER — ALBUTEROL SULFATE HFA 108 (90 BASE) MCG/ACT IN AERS: 2.0000 | INHALATION_SPRAY | Freq: Four times a day (QID) | RESPIRATORY_TRACT | 0 refills | Status: DC | PRN

## 2018-09-09 MED ORDER — OMEPRAZOLE 40 MG PO CPDR: 40.0000 mg | DELAYED_RELEASE_CAPSULE | Freq: Every day | ORAL | 3 refills | Status: DC

## 2018-09-09 NOTE — Progress Notes (Signed)
Lacey Burke is a 73 y.o. female here for an acute visit.  History of Present Illness:   Lonell Grandchild, CMA acting as scribe for Dr. Briscoe Deutscher.    Cough  This is a chronic problem. The current episode started more than 1 month ago. The problem has been unchanged. The problem occurs constantly. The cough is productive of sputum. Pertinent negatives include no chest pain, chills, ear pain, fever or wheezing. The treatment provided no relief. There is no history of asthma, COPD, emphysema or pneumonia.    PMHx, SurgHx, SocialHx, Medications, and Allergies were reviewed in the Visit Navigator and updated as appropriate.  Current Medications   Current Outpatient Medications:  .  Acetylcysteine (NAC) 500 MG CAPS, Take by mouth., Disp: , Rfl:  .  B Complex Vitamins (B COMPLEX 50) TABS, , Disp: , Rfl:  .  Calcium-Mag-Vit C-Vit D 185-28-2.5-200 CAPS, Take by mouth., Disp: , Rfl:  .  Cholecalciferol (D3-1000) 25 MCG (1000 UT) capsule, , Disp: , Rfl:  .  Coenzyme Q10 (COQ10 PO), Take 300 mg by mouth., Disp: , Rfl:  .  COLLAGEN-VITAMIN C PO, Take 2,500 mg by mouth., Disp: , Rfl:  .  escitalopram (LEXAPRO) 10 MG tablet, Take 1 tablet (10 mg total) by mouth at bedtime., Disp: 90 tablet, Rfl: 1 .  LORazepam (ATIVAN) 1 MG tablet, Take 1 tablet (1 mg total) by mouth at bedtime., Disp: 30 tablet, Rfl: 3 .  Misc Natural Products (RESVERATROL ULTRA) CAPS, , Disp: , Rfl:  .  Misc Natural Products (TART CHERRY ADVANCED) CAPS, Take by mouth., Disp: , Rfl:  .  Omega-3 Fatty Acids (FISH OIL) 600 MG CAPS, Take by mouth., Disp: , Rfl:  .  polyethylene glycol (MIRALAX / GLYCOLAX) packet, Take 17 g by mouth daily., Disp: , Rfl:  .  Psyllium (METAMUCIL FREE & NATURAL PO), , Disp: , Rfl:  .  triamcinolone ointment (KENALOG) 0.5 %, Apply 1 application topically 2 (two) times daily., Disp: 30 g, Rfl: 0 .  TURMERIC PO, Take 200 mg by mouth., Disp: , Rfl:  .  albuterol (PROVENTIL HFA;VENTOLIN HFA) 108 (90  Base) MCG/ACT inhaler, Inhale 2 puffs into the lungs every 6 (six) hours as needed for wheezing or shortness of breath., Disp: 1 Inhaler, Rfl: 0 .  amLODipine (NORVASC) 5 MG tablet, Take 1 tablet (5 mg total) by mouth daily., Disp: 90 tablet, Rfl: 2 .  omeprazole (PRILOSEC) 40 MG capsule, Take 1 capsule (40 mg total) by mouth daily., Disp: 30 capsule, Rfl: 3  Current Facility-Administered Medications:  .  EPINEPHrine (EPI-PEN) injection 0.3 mg, 0.3 mg, Intramuscular, UD, Wardell Honour, MD   Allergies  Allergen Reactions  . Bee Venom Rash and Anaphylaxis    ON LEGS  . Mobic [Meloxicam]   . Other Other (See Comments)    Other reaction: sneezing, itchy eyes   Review of Systems   Pertinent items are noted in the HPI. Otherwise, ROS is negative.  Vitals   Vitals:   09/09/18 1141  BP: 136/88  Pulse: 68  Temp: 98.6 F (37 C)  TempSrc: Oral  SpO2: 98%  Weight: 117 lb 6.4 oz (53.3 kg)  Height: 5' (1.524 m)     Body mass index is 22.93 kg/m.  Physical Exam   Physical Exam Vitals signs and nursing note reviewed.  HENT:     Head: Normocephalic and atraumatic.  Eyes:     Pupils: Pupils are equal, round, and reactive to light.  Neck:  Musculoskeletal: Normal range of motion and neck supple.  Cardiovascular:     Rate and Rhythm: Normal rate and regular rhythm.     Heart sounds: Normal heart sounds.  Pulmonary:     Effort: Pulmonary effort is normal. No respiratory distress.     Breath sounds: No wheezing or rhonchi.  Abdominal:     Palpations: Abdomen is soft.  Skin:    General: Skin is warm.  Psychiatric:        Behavior: Behavior normal.      Results for orders placed or performed in visit on 03/27/18  Comprehensive metabolic panel  Result Value Ref Range   Sodium 137 135 - 145 mEq/L   Potassium 4.4 3.5 - 5.1 mEq/L   Chloride 103 96 - 112 mEq/L   CO2 25 19 - 32 mEq/L   Glucose, Bld 87 70 - 99 mg/dL   BUN 23 6 - 23 mg/dL   Creatinine, Ser 0.66 0.40 - 1.20  mg/dL   Total Bilirubin 0.5 0.2 - 1.2 mg/dL   Alkaline Phosphatase 67 39 - 117 U/L   AST 23 0 - 37 U/L   ALT 20 0 - 35 U/L   Total Protein 7.1 6.0 - 8.3 g/dL   Albumin 4.4 3.5 - 5.2 g/dL   Calcium 10.0 8.4 - 10.5 mg/dL   GFR 93.74 >60.00 mL/min  Lipid panel  Result Value Ref Range   Cholesterol 192 0 - 200 mg/dL   Triglycerides 74.0 0.0 - 149.0 mg/dL   HDL 81.80 >39.00 mg/dL   VLDL 14.8 0.0 - 40.0 mg/dL   LDL Cholesterol 95 0 - 99 mg/dL   Total CHOL/HDL Ratio 2    NonHDL 110.10     Assessment and Plan   [name] was seen today for cough.  Diagnoses and all orders for this visit:  Cough Comments: Patient with ongoing cough.  We discussed the fact that a bronchitic cough can linger for 6 to 8 weeks and be normal.  Will consider upper airway cough syndrome Orders: -     omeprazole (PRILOSEC) 40 MG capsule; Take 1 capsule (40 mg total) by mouth daily. -     amLODipine (NORVASC) 5 MG tablet; Take 1 tablet (5 mg total) by mouth daily. -     albuterol (PROVENTIL HFA;VENTOLIN HFA) 108 (90 Base) MCG/ACT inhaler; Inhale 2 puffs into the lungs every 6 (six) hours as needed for wheezing or shortness of breath. -     DG Chest 2 View  Pulmonary nodules Comments: Repeat chest x-ray today with stable pulmonary nodule and bronchitis changes.  We will continue to monitor.    . Reviewed expectations re: course of current medical issues. . Discussed self-management of symptoms. . Outlined signs and symptoms indicating need for more acute intervention. . Patient verbalized understanding and all questions were answered. Marland Kitchen Health Maintenance issues including appropriate healthy diet, exercise, and smoking avoidance were discussed with patient. . See orders for this visit as documented in the electronic medical record. . Patient received an After Visit Summary.  CMA served as Education administrator during this visit. History, Physical, and Plan performed by medical provider. The above documentation has been  reviewed and is accurate and complete. Briscoe Deutscher, D.O.  Briscoe Deutscher, DO [place] 09/10/2018

## 2018-09-10 ENCOUNTER — Encounter: Payer: Self-pay | Admitting: Family Medicine

## 2018-09-30 ENCOUNTER — Encounter: Payer: Self-pay | Admitting: Family Medicine

## 2018-09-30 DIAGNOSIS — R05 Cough: Secondary | ICD-10-CM

## 2018-09-30 DIAGNOSIS — R059 Cough, unspecified: Secondary | ICD-10-CM

## 2018-10-01 MED ORDER — OMEPRAZOLE 40 MG PO CPDR: 40.0000 mg | DELAYED_RELEASE_CAPSULE | Freq: Every day | ORAL | 3 refills | Status: DC

## 2018-11-10 ENCOUNTER — Other Ambulatory Visit: Payer: Self-pay | Admitting: Family Medicine

## 2018-11-10 DIAGNOSIS — F5104 Psychophysiologic insomnia: Secondary | ICD-10-CM

## 2018-11-19 IMAGING — CT CT CHEST W/O CM
2 of 3 series · 15 of 36 positions shown, 18 images · non-contrast
Comparison: High-resolution chest CT, 06/27/2016.

CLINICAL DATA: Follow-up lung nodules. Occasional cough. No
reported shortness of breath.

EXAM:
CT CHEST WITHOUT CONTRAST
TECHNIQUE: Multidetector CT imaging of the chest was performed following the
standard protocol without IV contrast.

[Series 2: thorax · axial · 0.64mm/px · z∈[+1110,+1356]mm · 12 of 145 slices shown, 15 images]
[im 11/145  mediastinal]
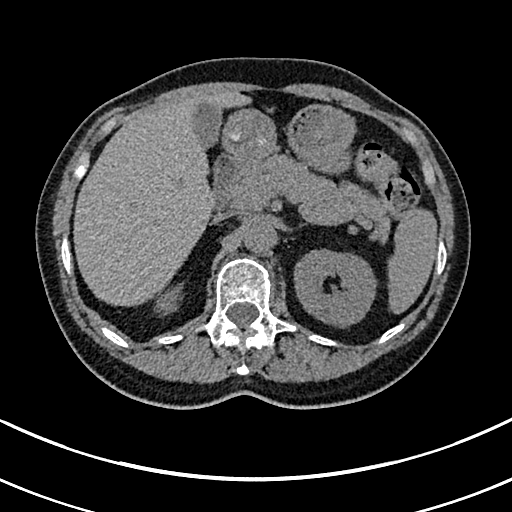
[im 11/145  lung]
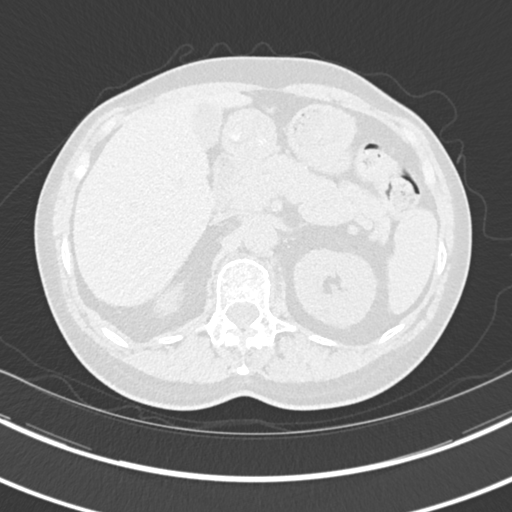
[im 22/145  lung]
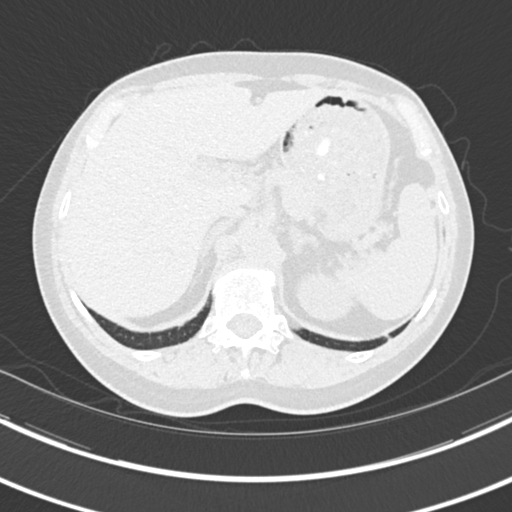
[im 33/145  lung]
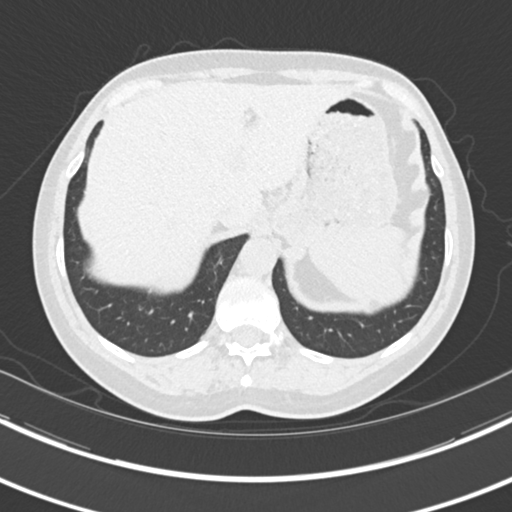
[im 43/145  lung]
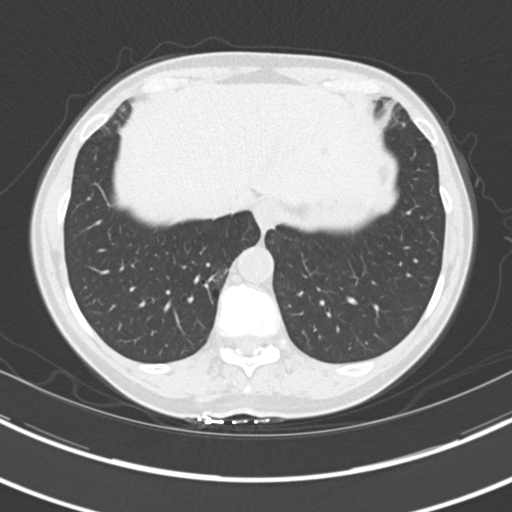
[im 54/145  mediastinal]
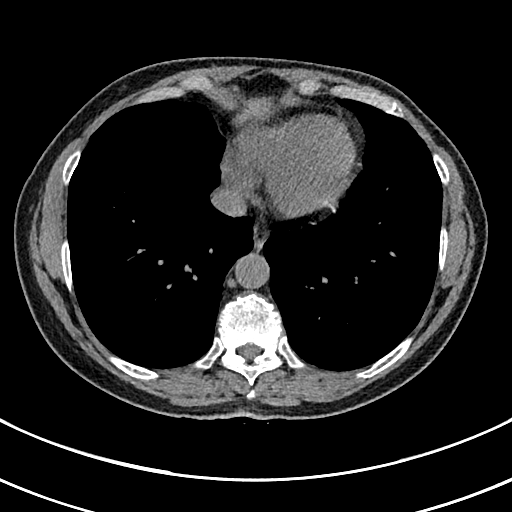
[im 54/145  lung]
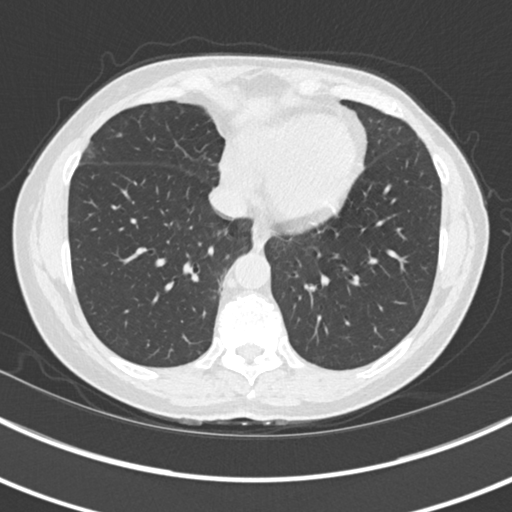
[im 65/145  lung]
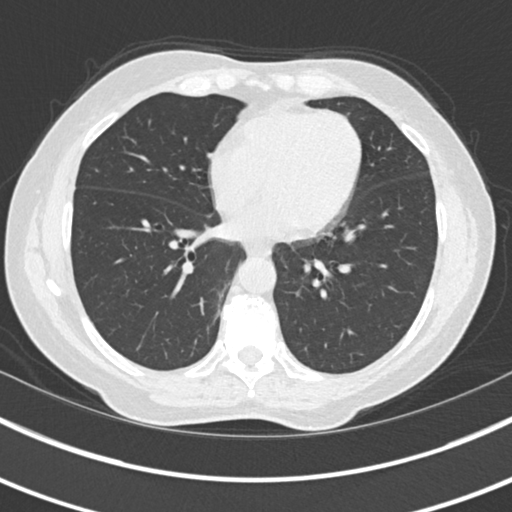
[im 81/145  lung]
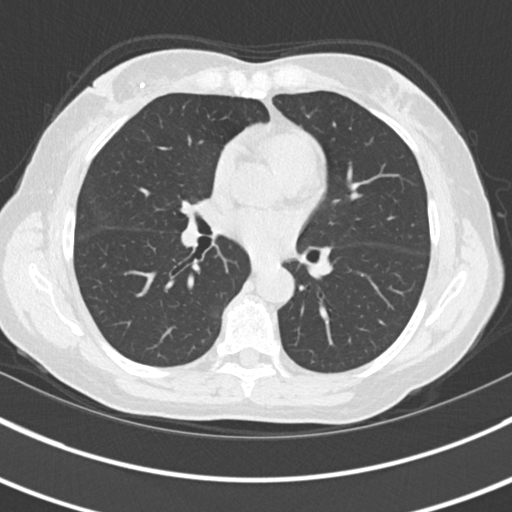
[im 91/145  lung]
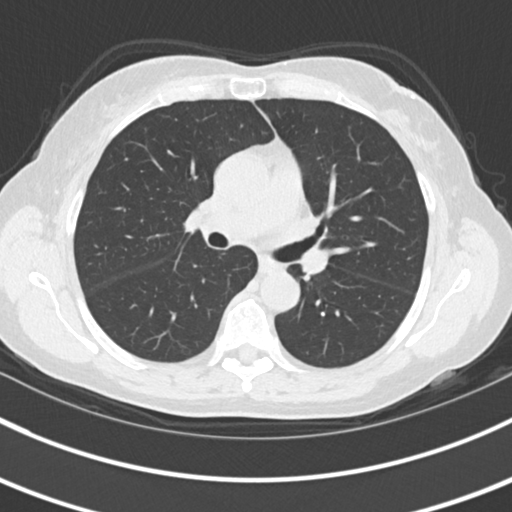
[im 102/145  mediastinal]
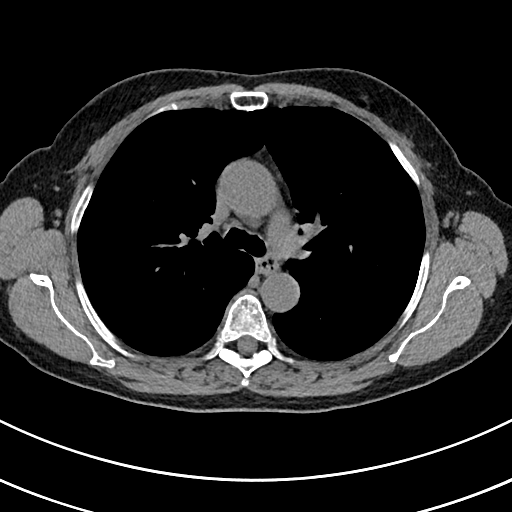
[im 102/145  lung]
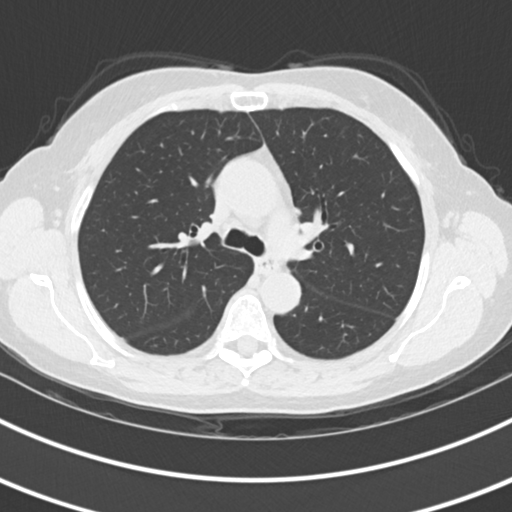
[im 113/145  lung]
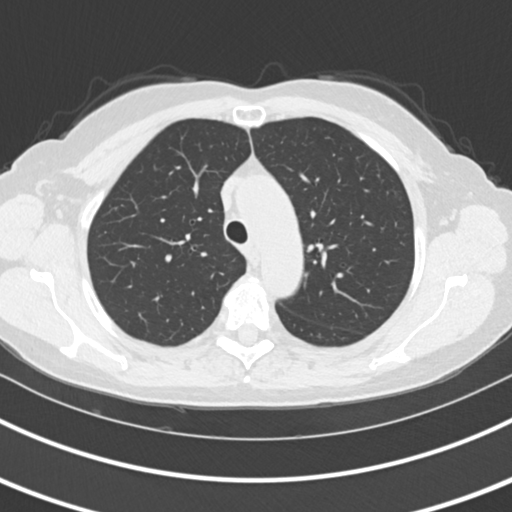
[im 123/145  lung]
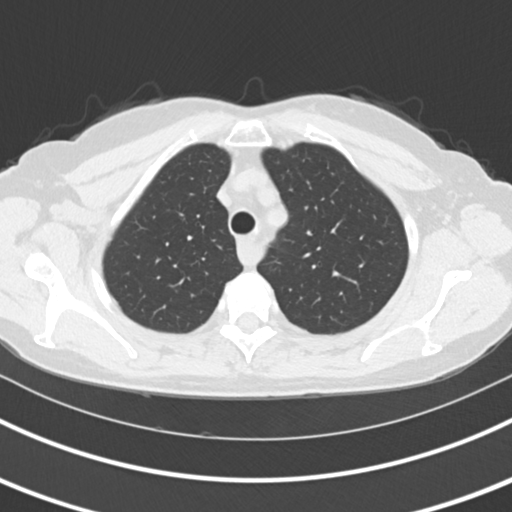
[im 134/145  lung]
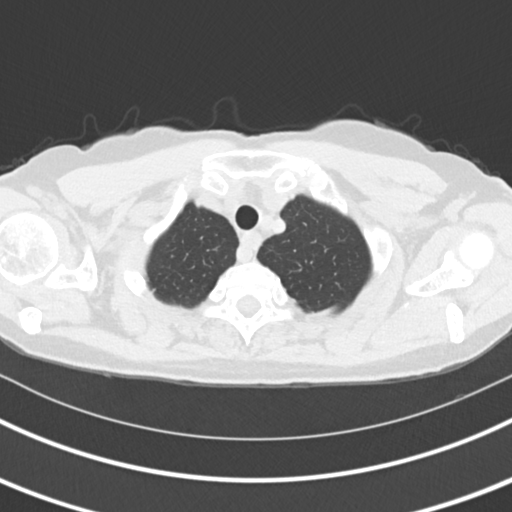

[Series 5: coronal · coronal · 0.59mm/px · 3 of 119 slices shown]
[im 24/119  lung]
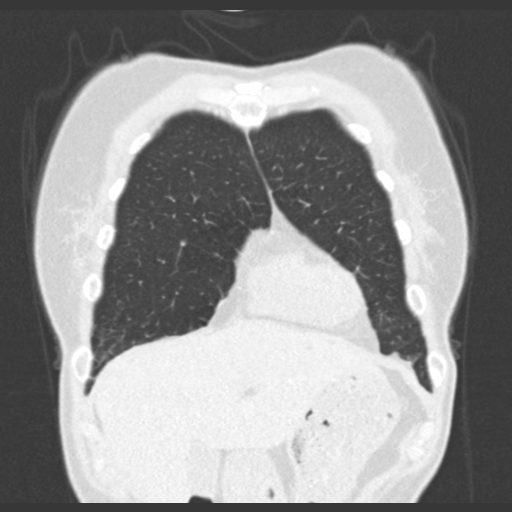
[im 48/119  lung]
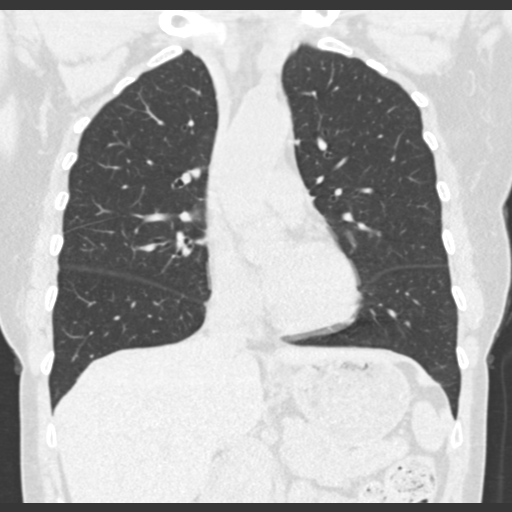
[im 71/119  lung]
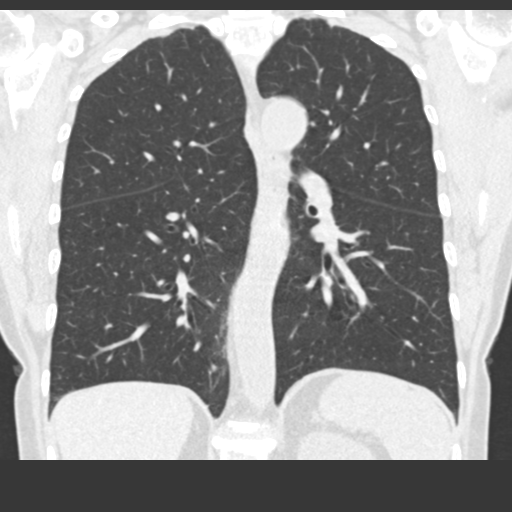

[15 of 36 positions shown; findings below may reference images not displayed]

FINDINGS: Cardiovascular: Heart is normal in size and configuration. No
pericardial effusion. No coronary artery calcifications. Great
vessels are normal in caliber. Aberrant right subclavian artery
again noted.

Mediastinum/Nodes: No enlarged mediastinal or axillary lymph nodes.
Thyroid gland, trachea, and esophagus demonstrate no significant
findings.

Lungs/Pleura: Nodule, right lower lobe, image 38, series [DATE] x 6
mm, mean 7 mm, unchanged from prior CT allowing for differences in
measurement technique. 2-3 mm nodule, left lower lobe, image 68,
stable. 2 mm subpleural nodule, right lower lobe, image 93, stable.
5 x 3 mm, mean 4 mm, subpleural posteromedial right lower lobe
nodule, image 86, stable.

Minor reticular opacities at the base of the right middle lobe and
left upper lobe lingula, and medial right lower lobe, consistent
with scarring and/or atelectasis. Lungs otherwise clear.

No pleural effusion or pneumothorax.

Upper Abdomen: No acute abnormality.

Musculoskeletal: No fracture or acute finding. No osteoblastic or
osteolytic lesions.
IMPRESSION: 1. Pulmonary nodules are stable from the prior exam. These are
benign stable for 21 months.
2. No new pulmonary nodules.
3. No acute findings.

## 2018-12-17 ENCOUNTER — Other Ambulatory Visit: Payer: Self-pay

## 2018-12-17 DIAGNOSIS — F5104 Psychophysiologic insomnia: Secondary | ICD-10-CM

## 2018-12-17 DIAGNOSIS — F419 Anxiety disorder, unspecified: Secondary | ICD-10-CM

## 2018-12-18 MED ORDER — ESCITALOPRAM OXALATE 10 MG PO TABS: 10.0000 mg | ORAL_TABLET | Freq: Every day | ORAL | 1 refills | Status: DC

## 2018-12-18 MED ORDER — LORAZEPAM 1 MG PO TABS: 1.0000 mg | ORAL_TABLET | Freq: Every day | ORAL | 0 refills | Status: DC

## 2018-12-28 ENCOUNTER — Encounter: Payer: Self-pay | Admitting: Family Medicine

## 2018-12-29 ENCOUNTER — Other Ambulatory Visit: Payer: Self-pay

## 2018-12-29 DIAGNOSIS — R059 Cough, unspecified: Secondary | ICD-10-CM

## 2018-12-29 DIAGNOSIS — R05 Cough: Secondary | ICD-10-CM

## 2018-12-29 MED ORDER — AMLODIPINE BESYLATE 5 MG PO TABS: 5.0000 mg | ORAL_TABLET | Freq: Every day | ORAL | 2 refills | Status: DC

## 2019-02-08 ENCOUNTER — Encounter: Payer: Self-pay | Admitting: Family Medicine

## 2019-03-18 ENCOUNTER — Other Ambulatory Visit: Payer: Self-pay | Admitting: Family Medicine

## 2019-03-18 DIAGNOSIS — F5104 Psychophysiologic insomnia: Secondary | ICD-10-CM

## 2019-03-18 NOTE — Telephone Encounter (Signed)
Last OV 09/09/18 Last refill 12/18/18 #90/0 Next OV not scheduled  Forwarding to Dr. Juleen China

## 2019-04-28 ENCOUNTER — Encounter: Payer: Self-pay | Admitting: Family Medicine

## 2019-04-28 ENCOUNTER — Ambulatory Visit: Payer: Medicare Other | Admitting: Family Medicine

## 2019-04-28 ENCOUNTER — Other Ambulatory Visit: Payer: Self-pay

## 2019-04-28 VITALS — BP 128/82 | HR 70 | Temp 97.5°F | Ht 60.5 in | Wt 108.8 lb

## 2019-04-28 DIAGNOSIS — I10 Essential (primary) hypertension: Secondary | ICD-10-CM

## 2019-04-28 DIAGNOSIS — F5104 Psychophysiologic insomnia: Secondary | ICD-10-CM

## 2019-04-28 DIAGNOSIS — Z634 Disappearance and death of family member: Secondary | ICD-10-CM | POA: Diagnosis not present

## 2019-04-28 DIAGNOSIS — F419 Anxiety disorder, unspecified: Secondary | ICD-10-CM | POA: Diagnosis not present

## 2019-04-28 DIAGNOSIS — F32A Depression, unspecified: Secondary | ICD-10-CM

## 2019-04-28 DIAGNOSIS — F329 Major depressive disorder, single episode, unspecified: Secondary | ICD-10-CM

## 2019-04-28 DIAGNOSIS — M810 Age-related osteoporosis without current pathological fracture: Secondary | ICD-10-CM

## 2019-04-28 NOTE — Progress Notes (Signed)
Subjective:    Patient ID: Lacey Burke, female    DOB: 02-24-1946, 73 y.o.   MRN: [id number]  HPI Chief Complaint  Patient presents with  . new pt    new pt get established, needs refills on meds and depressed due to parnter passing away   She is new to the practice and here to establish care. Previous medical care: Briscoe Deutscher at [facility]   Other providers: OB/GYN- [name]   HTN- BP controlled on amlodipine 5 mg once daily. She checks her BP at home. Brought in readings for review.   Anxiety and depression- exacerbated since her partner died in April 25, 2023. She has a good support network in her sister, children and grandchildren. States she is struggling doing things she usually enjoys doing.  She has been taking Ativan 1 mg at bedtime for the past 2 years for anxiety and insomnia.  States she has tried Trazodone in the past and it did not help  States anxiety runs in her family.   States she has been on Lexapro for the past 4-5 years.  States she has never been involved in counseling. Has never seen a psychiatrist   Chronic constipation- takes Miralax   Retired Pharmacist, hospital.   Osteoporosis - states she is not on medication and does not want to take the medications for this. States her gynecologist has tried to have her take medication for this.  Exercising and taking vitamin D and calcium.   Denies fever, chills, dizziness, chest pain, palpitations, shortness of breath, abdominal pain, N/V/D, urinary symptoms, LE edema.     Depression screen [facility] 2/9 04/28/2019 12/09/2017 11/07/2017 10/06/2017 07/14/2017  Decreased Interest 3 0 0 0 0  Down, Depressed, Hopeless 3 0 0 0 0  PHQ - 2 Score 6 0 0 0 0  Altered sleeping 3 - - - -  Tired, decreased energy 3 - - - -  Change in appetite 3 - - - -  Trouble concentrating 0 - - - -  Moving slowly or fidgety/restless 3 - - - -  Suicidal thoughts 0 - - - -  PHQ-9 Score 18 - - - -  Difficult doing  work/chores Not difficult at all - - - -     Review of Systems Pertinent positives and negatives in the history of present illness.     Objective:   Physical Exam BP 128/82   Pulse 70   Temp (!) 97.5 F (36.4 C)   Ht 5' 0.5" (1.537 m)   Wt 108 lb 12.8 oz (49.4 kg)   BMI 20.90 kg/m   Alert and in no distress. Cardiac exam shows a regular rate and rhythm  Lungs are clear to auscultation. Extremities without edema. Denies SI.       Assessment & Plan:  Anxiety and depression -continue on Lexapro. Encouraged her to cut back on Ativan to 1/2 tablet and try to wean off. Advised her to get involved in therapy. She is interested. Does not appear to be in any danger of self harm.  Will have her return in 4 weeks.   Death of life partner- recommend she call and schedule with grief counselor through Hospice since her partner was involved with them prior to his death.   Psychophysiological insomnia- discussed good sleep hygiene and training herself to sleep again. Advised to cut back on Ativan dose to 1/2 tablet. Trazodone has not worked in the past.   Essential hypertension- well controlled. Continue  on amlodipine.   Age-related osteoporosis without current pathological fracture- is not interested in treatment. Discussed this with her OB/GYN

## 2019-04-28 NOTE — Patient Instructions (Signed)
It was a pleasure meeting you today.   Please call and schedule with hospice for grief counseling.   Continue on your current medications but try to cut back to 1/2 tablet of the Ativan at bedtime.   Follow up with me in 4 weeks.

## 2019-05-06 ENCOUNTER — Other Ambulatory Visit: Payer: Self-pay | Admitting: Family Medicine

## 2019-05-06 DIAGNOSIS — F419 Anxiety disorder, unspecified: Secondary | ICD-10-CM

## 2019-05-26 ENCOUNTER — Ambulatory Visit: Payer: Medicare Other | Admitting: Family Medicine

## 2019-05-26 ENCOUNTER — Other Ambulatory Visit: Payer: Self-pay

## 2019-05-26 ENCOUNTER — Encounter: Payer: Self-pay | Admitting: Family Medicine

## 2019-05-26 VITALS — BP 110/60 | HR 60 | Wt 108.8 lb

## 2019-05-26 DIAGNOSIS — F419 Anxiety disorder, unspecified: Secondary | ICD-10-CM

## 2019-05-26 DIAGNOSIS — F5104 Psychophysiologic insomnia: Secondary | ICD-10-CM | POA: Diagnosis not present

## 2019-05-26 DIAGNOSIS — F32A Depression, unspecified: Secondary | ICD-10-CM

## 2019-05-26 DIAGNOSIS — Z634 Disappearance and death of family member: Secondary | ICD-10-CM

## 2019-05-26 DIAGNOSIS — F329 Major depressive disorder, single episode, unspecified: Secondary | ICD-10-CM

## 2019-05-26 NOTE — Progress Notes (Signed)
   Subjective:    Patient ID: Lacey Burke, female    DOB: [birth year], 73 y.o.   MRN: [id number]  HPI Chief Complaint  Patient presents with  . follow-up    follow-up on depression, feels better   Here for follow up on anxiety and depression related to death of her life partner. She is taking Lexapro and doing well on this.  States she is feeling much better than her previous visit. 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  History of insomnia and at her previous visit she was taking Ativan 1 mg at bedtime.  She had been taking this dose for several months. I encouraged her to try and cut back on Ativan dose and she is now taking 1/2 at bedtime nightly.   Denies any new concerns today.  States she is feeling physically well.  Denies fever, chills, dizziness, chest pain, palpitations, shortness of breath, abdominal pain, N/V/D, urinary symptoms, LE edema.      Depression screen [facility] 2/9 05/26/2019 04/28/2019 12/09/2017 11/07/2017 10/06/2017  Decreased Interest 2 3 0 0 0  Down, Depressed, Hopeless 2 3 0 0 0  PHQ - 2 Score 4 6 0 0 0  Altered sleeping 3 3 - - -  Tired, decreased energy 0 3 - - -  Change in appetite 0 3 - - -  Feeling bad or failure about yourself  0 - - - -  Trouble concentrating 0 0 - - -  Moving slowly or fidgety/restless 1 3 - - -  Suicidal thoughts 0 0 - - -  PHQ-9 Score 8 18 - - -  Difficult doing work/chores Not difficult at all Not difficult at all - - -   Reviewed allergies, medications, past medical, surgical, family, and social history.    Review of Systems Pertinent positives and negatives in the history of present illness.      Objective:   Physical Exam BP 110/60   Pulse 60   Wt 108 lb 12.8 oz (49.4 kg)   BMI 20.90 kg/m   Alert and oriented in no acute distress.  Respirations unlabored. Normal speech, mood and thought process.       Assessment & Plan:  Anxiety and depression -Continue on Lexapro. Appears to be in  better spirits. See Hospice counselor tomorrow as scheduled.   Death of life partner- counseling with Hospice   Psychophysiological insomnia- she has but back to 1/2 tablet of Ativan per my recommendation and sleeping fine. Our goal is to wean her off of this entirely but this may take some time. I will refill at reduced dose when refill is needed. She is overdue for AWV and will schedule at her convenience.

## 2019-07-29 ENCOUNTER — Encounter: Payer: Self-pay | Admitting: Family Medicine

## 2019-07-29 DIAGNOSIS — F419 Anxiety disorder, unspecified: Secondary | ICD-10-CM

## 2019-07-30 MED ORDER — ESCITALOPRAM OXALATE 10 MG PO TABS: 10.0000 mg | ORAL_TABLET | Freq: Every day | ORAL | 1 refills | Status: DC

## 2019-07-30 NOTE — Telephone Encounter (Signed)
Please give her 90 day refill

## 2019-08-12 ENCOUNTER — Ambulatory Visit: Payer: Medicare Other

## 2019-08-20 ENCOUNTER — Ambulatory Visit: Payer: Medicare PPO | Attending: Internal Medicine

## 2019-08-20 DIAGNOSIS — Z23 Encounter for immunization: Secondary | ICD-10-CM

## 2019-08-20 NOTE — Progress Notes (Signed)
   Covid-19 Vaccination Clinic  Name:  Lacey Burke    MRN: [id number] DOB: 04-Dec-1945  08/20/2019  Lacey Burke was observed post Covid-19 immunization for 15 minutes without incidence. She was provided with Vaccine Information Sheet and instruction to access the V-Safe system.   Lacey Burke was instructed to call 911 with any severe reactions post vaccine: Marland Kitchen Difficulty breathing  . Swelling of your face and throat  . A fast heartbeat  . A bad rash all over your body  . Dizziness and weakness    Immunizations Administered    Name Date Dose VIS Date Route   Pfizer COVID-19 Vaccine 08/20/2019  3:13 PM 0.3 mL 06/25/2019 Intramuscular   Manufacturer: [place]   Lot: [id number]   [place]: [id number]

## 2019-08-24 ENCOUNTER — Encounter: Payer: Self-pay | Admitting: Family Medicine

## 2019-08-24 DIAGNOSIS — R059 Cough, unspecified: Secondary | ICD-10-CM

## 2019-08-24 DIAGNOSIS — R05 Cough: Secondary | ICD-10-CM

## 2019-08-25 MED ORDER — AMLODIPINE BESYLATE 5 MG PO TABS: 5.0000 mg | ORAL_TABLET | Freq: Every day | ORAL | 0 refills | Status: DC

## 2019-09-07 ENCOUNTER — Other Ambulatory Visit: Payer: Self-pay | Admitting: Physician Assistant

## 2019-09-07 DIAGNOSIS — D485 Neoplasm of uncertain behavior of skin: Secondary | ICD-10-CM | POA: Diagnosis not present

## 2019-09-07 DIAGNOSIS — L719 Rosacea, unspecified: Secondary | ICD-10-CM | POA: Diagnosis not present

## 2019-09-07 DIAGNOSIS — L308 Other specified dermatitis: Secondary | ICD-10-CM | POA: Diagnosis not present

## 2019-09-15 ENCOUNTER — Ambulatory Visit: Payer: Medicare PPO | Attending: Internal Medicine

## 2019-09-15 DIAGNOSIS — Z23 Encounter for immunization: Secondary | ICD-10-CM | POA: Insufficient documentation

## 2019-09-15 NOTE — Progress Notes (Signed)
   Covid-19 Vaccination Clinic  Name:  Lacey Burke    MRN: [id number] DOB: 1945-10-02  09/15/2019  Lacey Burke was observed post Covid-19 immunization for 15 minutes without incident. She was provided with Vaccine Information Sheet and instruction to access the V-Safe system.   Lacey Burke was instructed to call 911 with any severe reactions post vaccine: Marland Kitchen Difficulty breathing  . Swelling of face and throat  . A fast heartbeat  . A bad rash all over body  . Dizziness and weakness   Immunizations Administered    Name Date Dose VIS Date Route   Pfizer COVID-19 Vaccine 09/15/2019  1:59 PM 0.3 mL 06/25/2019 Intramuscular   Manufacturer: [place]   Lot: [id number]   [place]: [id number]

## 2019-10-14 DIAGNOSIS — Z961 Presence of intraocular lens: Secondary | ICD-10-CM | POA: Diagnosis not present

## 2019-10-14 DIAGNOSIS — H524 Presbyopia: Secondary | ICD-10-CM | POA: Diagnosis not present

## 2019-10-14 DIAGNOSIS — H52203 Unspecified astigmatism, bilateral: Secondary | ICD-10-CM | POA: Diagnosis not present

## 2019-10-19 ENCOUNTER — Other Ambulatory Visit: Payer: Self-pay

## 2019-10-19 ENCOUNTER — Ambulatory Visit: Payer: Medicare PPO | Admitting: Physician Assistant

## 2019-10-19 ENCOUNTER — Encounter: Payer: Self-pay | Admitting: Physician Assistant

## 2019-10-19 DIAGNOSIS — L659 Nonscarring hair loss, unspecified: Secondary | ICD-10-CM

## 2019-10-19 DIAGNOSIS — L570 Actinic keratosis: Secondary | ICD-10-CM | POA: Diagnosis not present

## 2019-10-19 NOTE — Progress Notes (Signed)
   Follow-Up Visit   Subjective  Lacey Burke is a 74 y.o. female who presents for the following: Skin Problem (Patient here today for white area under Left eye x unsure.Patient denies pain, crust, or bleeding.  Patient would also like something for Rosacea.).  Location: left cheek  Duration: 1-2 months Quality: white bump Associated Signs/Symptoms: none Modifying Factors: persistent   The following portions of the chart were reviewed this encounter and updated as appropriate: Tobacco  Allergies  Meds  Med Hx  Surg Hx      Objective  Well appearing patient in no apparent distress; mood and affect are within normal limits.  A focused examination was performed including face. Relevant physical exam findings are noted in the Assessment and Plan.  Objective  Left Tip of Nose: Slight PIH  Objective  scalp: Diffuse loss of hair   Assessment & Plan  Actinic keratosis Left Tip of Nose  observe  Alopecia scalp  OTC minoxidil

## 2019-10-26 DIAGNOSIS — Z1231 Encounter for screening mammogram for malignant neoplasm of breast: Secondary | ICD-10-CM | POA: Diagnosis not present

## 2019-10-26 LAB — HM MAMMOGRAPHY

## 2019-11-29 ENCOUNTER — Encounter: Payer: Self-pay | Admitting: Family Medicine

## 2019-11-29 DIAGNOSIS — R059 Cough, unspecified: Secondary | ICD-10-CM

## 2019-11-30 MED ORDER — AMLODIPINE BESYLATE 5 MG PO TABS: 5.0000 mg | ORAL_TABLET | Freq: Every day | ORAL | 0 refills | Status: DC

## 2020-01-04 NOTE — Progress Notes (Signed)
Lacey Burke is a 74 y.o. female who presents for annual wellness visit, CPE and follow-up on chronic medical conditions.  She has the following concerns:  Complains of irritation and burning to the corner of her mouth. No hx of herpes. Has been eating hot peppers.   States her mood is ok. Taking Lexapro  She is no longer using alprazolam  Taking CBD gummies  HTN- checks BP at home and always in goal range.   Ostoeporosis- took Fosamax for one year or so and had to stop due to severe reflux.  This is managed by Dr. Pamala Hurry   Immunization History  Administered Date(s) Administered   Influenza Split 03/16/2015   Influenza, High Dose Seasonal PF 04/24/2018, 04/18/2019   Influenza-Unspecified 05/15/2013, 05/22/2013, 06/13/2014, 05/11/2016, 04/18/2019   PFIZER SARS-COV-2 Vaccination 08/20/2019, 09/15/2019   Pneumococcal Polysaccharide-23 04/09/2017   Pneumococcal-Unspecified 07/15/2016   Tdap 11/17/2007, 01/09/2018   Zoster 01/19/2015   Zoster Recombinat (Shingrix) 05/01/2018, 05/27/2018   Last gynecological exam: last year  Last mammogram: March 2021 Last colonoscopy:  Unsure when her last one was. May have been in 2014.  Last DEXA:  2018 Dentist: years ago  Ophtho: shapiro in April 2021 Exercise: daily   Other doctors caring for patient include: Obgyn- Adela Glimpse Dr. Willette Pa- Dermatology Dr. [name]- eyes   Lives alone. Has 2 kids.    Depression screen:  See questionnaire below.  Depression screen [facility] 2/9 01/05/2020 05/26/2019 04/28/2019 12/09/2017 11/07/2017  Decreased Interest 0 2 3 0 0  Down, Depressed, Hopeless 0 2 3 0 0  PHQ - 2 Score 0 4 6 0 0  Altered sleeping - 3 3 - -  Tired, decreased energy - 0 3 - -  Change in appetite - 0 3 - -  Feeling bad or failure about yourself  - 0 - - -  Trouble concentrating - 0 0 - -  Moving slowly or fidgety/restless - 1 3 - -  Suicidal thoughts - 0 0 - -  PHQ-9 Score - 8 18 - -  Difficult doing work/chores - Not  difficult at all Not difficult at all - -    Fall Risk Screen: see questionnaire below. Fall Risk  01/05/2020 04/28/2019 12/09/2017 11/07/2017 10/06/2017  Falls in the past year? 0 0 No No No  Comment - - - - -  Number falls in past yr: 0 0 - - -  Injury with Fall? 0 0 - - -  Comment - - - - -  Risk for fall due to : - - - - -    ADL screen:  See questionnaire below Functional Status Survey: Is the patient deaf or have difficulty hearing?: No Does the patient have difficulty seeing, even when wearing glasses/contacts?: Yes (had eyes checked last month. added vitamins to see if that would help) Does the patient have difficulty concentrating, remembering, or making decisions?: No Does the patient have difficulty walking or climbing stairs?: No Does the patient have difficulty dressing or bathing?: No Does the patient have difficulty doing errands alone such as visiting a doctor's office or shopping?: No   End of Life Discussion:  Patient does not have a living will and medical power of attorney. Forms discussed and provided.   Review of Systems Constitutional: -fever, -chills, -sweats, -unexpected weight change, -anorexia, -fatigue Allergy: -sneezing, -itching, -congestion Dermatology: denies changing moles, rash, lumps, new worrisome lesions ENT: -runny nose, -ear pain, -sore throat, -hoarseness, -sinus pain, -teeth pain, -tinnitus, -hearing loss, -epistaxis Cardiology:  -  chest pain, -palpitations, -edema, -orthopnea, -paroxysmal nocturnal dyspnea Respiratory: -cough, -shortness of breath, -dyspnea on exertion, -wheezing, -hemoptysis Gastroenterology: -abdominal pain, -nausea, -vomiting, -diarrhea, -constipation, -blood in stool, -changes in bowel movement, -dysphagia Hematology: -bleeding or bruising problems Musculoskeletal: -arthralgias, -myalgias, -joint swelling, -back pain, -neck pain, -cramping, -gait changes Ophthalmology: -vision changes, -eye redness, -itching,  -discharge Urology: -dysuria, -difficulty urinating, -hematuria, -urinary frequency, -urgency, incontinence Neurology: -headache, -weakness, -tingling, -numbness, -speech abnormality, -memory loss, -falls, -dizziness Psychology:  -depressed mood, -agitation, -sleep problems    PHYSICAL EXAM:  BP 128/80    Pulse 65    Ht 5' (1.524 m)    Wt 106 lb (48.1 kg)    SpO2 99%    BMI 20.70 kg/m   General Appearance: Alert, cooperative, no distress, appears stated age Head: Normocephalic, without obvious abnormality, atraumatic Eyes: PERRL, conjunctiva/corneas clear, EOM's intact Ears: Normal TM's and external ear canals Nose: mask on Throat: mask on  Neck: Supple, no lymphadenopathy; thyroid: no enlargement/tenderness/nodules; no JVD Back: Spine nontender, no curvature, ROM normal, no CVA tenderness Lungs: Clear to auscultation bilaterally without wheezes, rales or ronchi; respirations unlabored Chest Wall: No tenderness or deformity Heart: Regular rate and rhythm, S1 and S2 normal, no murmur, rub or gallop Breast Exam: OB/GYN Abdomen: Soft, non-tender, nondistended, normoactive bowel sounds, no masses, no hepatosplenomegaly Genitalia: OB/GYN Extremities: No clubbing, cyanosis or edema Pulses: 2+ and symmetric all extremities Skin: Skin color, texture, turgor normal, no rashes or lesions Lymph nodes: Cervical, supraclavicular, and axillary nodes normal Neurologic: CNII-XII intact, normal strength, sensation and gait; reflexes 2+ and symmetric throughout Psych: Normal mood, affect, hygiene and grooming.  ASSESSMENT/PLAN: Medicare annual wellness visit, subsequent -here for her AWV. No falls, depression, difficulty with memory or ADLs. Advance directive counseling done. Medications reviewed.   Routine general medical examination at a health care facility - Plan: CBC with Differential/Platelet, Comprehensive metabolic panel, TSH, T4, free, Lipid panel -also here for a CPE. Preventive health  reviewed and updated. Referral to GI made. Follow up with OB/GYN as recommended. Immunizations reviewed and UTD. Counseling on healthy lifestyle. Discussed safety.   Anxiety and depression -her mood has improved. Continue Lexapro. No longer taking alprazolam   Essential hypertension - Plan: CBC with Differential/Platelet, Comprehensive metabolic panel -well controlled,. Continue medication   Age-related osteoporosis without current pathological fracture - Plan: VITAMIN D 25 Hydroxy (Vit-D Deficiency, Fractures) -managed by OB/GYN. Continue vitamin D, exercise and get adequate calcium in diet. Recommend she discuss treatment option with gyne or return here  Screen for colon cancer - Plan: Ambulatory referral to Gastroenterology -referral per guidelines   Estrogen deficiency - Plan: VITAMIN D 25 Hydroxy (Vit-D Deficiency, Fractures) -increased risk for worsening osteoporosis   Vitamin D deficiency - Plan: VITAMIN D 25 Hydroxy (Vit-D Deficiency, Fractures) -check vitamin D and adjust dose as appropriate  Screening for lipid disorders - Plan: Lipid panel -follow up pending results. Continue healthy diet and exercise   Screening for thyroid disorder - Plan: TSH, T4, free -follow up pending results      Discussed monthly self breast exams and yearly mammograms; at least 30 minutes of aerobic activity at least 5 days/week and weight-bearing exercise 2x/week; proper sunscreen use reviewed; healthy diet, including goals of calcium and vitamin D intake and alcohol recommendations (less than or equal to 1 drink/day) reviewed; regular seatbelt use; changing batteries in smoke detectors.  Immunization recommendations discussed.  Colonoscopy recommendations reviewed   Medicare Attestation I have personally reviewed: The patient's medical and social history Their  use of alcohol, tobacco or illicit drugs Their current medications and supplements The patient's functional ability including  ADLs,fall risks, home safety risks, cognitive, and hearing and visual impairment Diet and physical activities Evidence for depression or mood disorders  The patient's weight, height, and BMI have been recorded in the chart.  I have made referrals, counseling, and provided education to the patient based on review of the above and I have provided the patient with a written personalized care plan for preventive services.     Harland Dingwall, NP-C   01/05/2020

## 2020-01-05 ENCOUNTER — Ambulatory Visit: Payer: Medicare PPO | Admitting: Family Medicine

## 2020-01-05 VITALS — BP 128/80 | HR 65 | Ht 60.0 in | Wt 106.0 lb

## 2020-01-05 DIAGNOSIS — I10 Essential (primary) hypertension: Secondary | ICD-10-CM | POA: Diagnosis not present

## 2020-01-05 DIAGNOSIS — Z1329 Encounter for screening for other suspected endocrine disorder: Secondary | ICD-10-CM

## 2020-01-05 DIAGNOSIS — Z0000 Encounter for general adult medical examination without abnormal findings: Secondary | ICD-10-CM

## 2020-01-05 DIAGNOSIS — Z1211 Encounter for screening for malignant neoplasm of colon: Secondary | ICD-10-CM

## 2020-01-05 DIAGNOSIS — E559 Vitamin D deficiency, unspecified: Secondary | ICD-10-CM

## 2020-01-05 DIAGNOSIS — Z13220 Encounter for screening for lipoid disorders: Secondary | ICD-10-CM

## 2020-01-05 DIAGNOSIS — E2839 Other primary ovarian failure: Secondary | ICD-10-CM

## 2020-01-05 DIAGNOSIS — F419 Anxiety disorder, unspecified: Secondary | ICD-10-CM

## 2020-01-05 DIAGNOSIS — F32A Depression, unspecified: Secondary | ICD-10-CM

## 2020-01-05 DIAGNOSIS — M810 Age-related osteoporosis without current pathological fracture: Secondary | ICD-10-CM | POA: Diagnosis not present

## 2020-01-05 DIAGNOSIS — F329 Major depressive disorder, single episode, unspecified: Secondary | ICD-10-CM

## 2020-01-05 NOTE — Patient Instructions (Signed)
  Ms. Najarro , Thank you for taking time to come for your Medicare Wellness Visit. I appreciate your ongoing commitment to your health goals. Please review the following plan we discussed and let me know if I can assist you in the future.   These are the goals we discussed:  Continue your current medications and taking good care of yourself.   Follow up with your OB/GYN for osteoporosis.   I will be in touch with your lab results.     This is a list of the screening recommended for you and due dates:  Health Maintenance  Topic Date Due  . Pneumonia vaccines (2 of 2 - PCV13) 04/09/2018  . Mammogram  09/05/2018  . Flu Shot  02/13/2020  . Colon Cancer Screening  01/05/2023  . Tetanus Vaccine  01/10/2028  . DEXA scan (bone density measurement)  Completed  . COVID-19 Vaccine  Completed  .  Hepatitis C: One time screening is recommended by Center for Disease Control  (CDC) for  adults born from [birth year] through 1964.   Completed

## 2020-01-06 LAB — TSH: TSH: 1.81 u[IU]/mL (ref 0.450–4.500)

## 2020-01-06 LAB — CBC WITH DIFFERENTIAL/PLATELET
Basophils Absolute: 0.1 10*3/uL (ref 0.0–0.2)
Basos: 1 %
EOS (ABSOLUTE): 0.1 10*3/uL (ref 0.0–0.4)
Eos: 1 %
Hematocrit: 46.1 % (ref 34.0–46.6)
Hemoglobin: 16.1 g/dL — ABNORMAL HIGH (ref 11.1–15.9)
Immature Grans (Abs): 0 10*3/uL (ref 0.0–0.1)
Immature Granulocytes: 1 %
Lymphocytes Absolute: 1 10*3/uL (ref 0.7–3.1)
Lymphs: 16 %
MCH: 31.6 pg (ref 26.6–33.0)
MCHC: 34.9 g/dL (ref 31.5–35.7)
MCV: 90 fL (ref 79–97)
Monocytes Absolute: 0.4 10*3/uL (ref 0.1–0.9)
Monocytes: 5 %
Neutrophils Absolute: 5 10*3/uL (ref 1.4–7.0)
Neutrophils: 76 %
Platelets: 347 10*3/uL (ref 150–450)
RBC: 5.1 x10E6/uL (ref 3.77–5.28)
RDW: 12.8 % (ref 11.7–15.4)
WBC: 6.6 10*3/uL (ref 3.4–10.8)

## 2020-01-06 LAB — COMPREHENSIVE METABOLIC PANEL
ALT: 24 IU/L (ref 0–32)
AST: 30 IU/L (ref 0–40)
Albumin/Globulin Ratio: 2.3 — ABNORMAL HIGH (ref 1.2–2.2)
Albumin: 4.9 g/dL — ABNORMAL HIGH (ref 3.7–4.7)
Alkaline Phosphatase: 102 IU/L (ref 48–121)
BUN/Creatinine Ratio: 24 (ref 12–28)
BUN: 17 mg/dL (ref 8–27)
Bilirubin Total: 0.7 mg/dL (ref 0.0–1.2)
CO2: 24 mmol/L (ref 20–29)
Calcium: 10.1 mg/dL (ref 8.7–10.3)
Chloride: 100 mmol/L (ref 96–106)
Creatinine, Ser: 0.72 mg/dL (ref 0.57–1.00)
GFR calc Af Amer: 96 mL/min/{1.73_m2} (ref >59)
GFR calc non Af Amer: 83 mL/min/{1.73_m2} (ref >59)
Globulin, Total: 2.1 g/dL (ref 1.5–4.5)
Glucose: 86 mg/dL (ref 65–99)
Potassium: 5.2 mmol/L (ref 3.5–5.2)
Sodium: 139 mmol/L (ref 134–144)
Total Protein: 7 g/dL (ref 6.0–8.5)

## 2020-01-06 LAB — LIPID PANEL
Chol/HDL Ratio: 2.1 ratio (ref 0.0–4.4)
Cholesterol, Total: 223 mg/dL — ABNORMAL HIGH (ref 100–199)
HDL: 105 mg/dL (ref >39)
LDL Chol Calc (NIH): 106 mg/dL — ABNORMAL HIGH (ref 0–99)
Triglycerides: 71 mg/dL (ref 0–149)
VLDL Cholesterol Cal: 12 mg/dL (ref 5–40)

## 2020-01-06 LAB — T4, FREE: Free T4: 1.27 ng/dL (ref 0.82–1.77)

## 2020-01-06 LAB — VITAMIN D 25 HYDROXY (VIT D DEFICIENCY, FRACTURES): Vit D, 25-Hydroxy: 47.2 ng/mL (ref 30.0–100.0)

## 2020-01-13 ENCOUNTER — Telehealth: Payer: Self-pay | Admitting: Internal Medicine

## 2020-01-13 NOTE — Telephone Encounter (Signed)
Hi Dr. Hilarie Fredrickson,   We received a referral from PCP for a repeat colonoscopy. Patient had colonoscopy done in 2014 over at [facility] with Dr. Collene Mares operative report is in [place].   Please advise on scheduling.  Thank you

## 2020-01-18 NOTE — Telephone Encounter (Signed)
Recall entered in epic. Please let referring office know.

## 2020-01-18 NOTE — Telephone Encounter (Signed)
Patient had a complete colonoscopy with a good bowel preparation with Dr. Collene Mares in June 2014 She would be due average risk screening colonoscopy in June 2024, you can place a recall for this date I checked her personal and family history and do not see a history of colon cancer or advanced colon polyps.  If I am incorrect this would change the recommendation for screening.

## 2020-01-19 ENCOUNTER — Encounter: Payer: Self-pay | Admitting: Family Medicine

## 2020-01-28 ENCOUNTER — Encounter: Payer: Self-pay | Admitting: Family Medicine

## 2020-01-28 DIAGNOSIS — F419 Anxiety disorder, unspecified: Secondary | ICD-10-CM

## 2020-01-31 MED ORDER — ESCITALOPRAM OXALATE 10 MG PO TABS: 10.0000 mg | ORAL_TABLET | Freq: Every day | ORAL | 0 refills | Status: DC

## 2020-02-15 ENCOUNTER — Encounter: Payer: Self-pay | Admitting: Internal Medicine

## 2020-02-15 ENCOUNTER — Telehealth: Payer: Self-pay | Admitting: Family Medicine

## 2020-02-15 NOTE — Telephone Encounter (Signed)
Requested records received from [place]

## 2020-02-28 ENCOUNTER — Encounter: Payer: Self-pay | Admitting: Family Medicine

## 2020-02-28 DIAGNOSIS — R05 Cough: Secondary | ICD-10-CM

## 2020-02-28 DIAGNOSIS — R059 Cough, unspecified: Secondary | ICD-10-CM

## 2020-02-28 MED ORDER — AMLODIPINE BESYLATE 5 MG PO TABS: 5.0000 mg | ORAL_TABLET | Freq: Every day | ORAL | 0 refills | Status: DC

## 2020-03-03 ENCOUNTER — Encounter: Payer: Self-pay | Admitting: Family Medicine

## 2020-03-08 DIAGNOSIS — Z20828 Contact with and (suspected) exposure to other viral communicable diseases: Secondary | ICD-10-CM | POA: Diagnosis not present

## 2020-03-14 DIAGNOSIS — Z20822 Contact with and (suspected) exposure to covid-19: Secondary | ICD-10-CM | POA: Diagnosis not present

## 2020-03-30 ENCOUNTER — Ambulatory Visit: Payer: Medicare PPO | Admitting: Sports Medicine

## 2020-03-30 ENCOUNTER — Other Ambulatory Visit: Payer: Self-pay

## 2020-03-30 VITALS — BP 122/82 | Ht 60.0 in | Wt 106.0 lb

## 2020-03-30 DIAGNOSIS — Z87312 Personal history of (healed) stress fracture: Secondary | ICD-10-CM | POA: Diagnosis not present

## 2020-03-30 DIAGNOSIS — Y9301 Activity, walking, marching and hiking: Secondary | ICD-10-CM

## 2020-03-30 DIAGNOSIS — M79672 Pain in left foot: Secondary | ICD-10-CM

## 2020-03-30 NOTE — Progress Notes (Signed)
Office Visit Note   Patient: Lacey Burke           Date of Birth: 15-Feb-1946           MRN: [id number] Visit Date: 03/30/2020 Requested by: Girtha Rm, NP-C [place],  [place] [id number] PCP: Girtha Rm, NP-C  Subjective: CC: Left Foot Pain  HPI: 74 year old female presenting to clinic today with concerns of left foot pain approximately 10 days ago.  Patient states she has a history of a stress fracture in this foot, and she is worried that she was starting to get a recurrence of the same.  She describes that approximately 1 month ago she bought herself a weighted vest to wear while walking because she has osteoporosis and she thought the extra weight would help thicken her bones.  Initially she was doing very well with [place], walking her typical 5 miles a day, but a little over a week ago she increased her walking to 11 miles in a day and during that walk she noticed a sharp pain in the top of her left foot.  She immediately became afraid that she was going to get a recurrence of her stress fracture and stopped walking.  The pain has not recurred since this initial insult and lasted only briefly after it first appeared.  She has not gone on one of her long walks again since this pain happened-she wanted to be seen in clinic before she wrist reinjury. She was last seen for similar pain approximately 5 years ago, at which time arch support insoles were discussed.  Patient states that instead of insoles she bought supportive Hoka shoes which she feels offered significant support and cushioning giving her a good pain-free 5 years until this incident.  She denies any pain today.  No swelling or bruising of the foot, no numbness in the foot.  No weakness. Patient's primary concern today is getting back to walking as soon as possible, and reducing her risk of injury.              ROS:   All other systems were reviewed and are negative.  Objective: Vital Signs: BP 122/82   Ht  5' (1.524 m)   Wt 106 lb (48.1 kg)   BMI 20.70 kg/m   Physical Exam:  General:  Alert and oriented, in no acute distress. Cardiac: Appears well perfused. Pulm:  Breathing unlabored. Psy:  Normal mood, congruent affect. Skin: No bruises appreciated with left foot examination. Left Foot: General assessment: Normal Gait.  Inspection: No significant pes planus or cavus. Normal posterior tibialis function with feel raise.   No significant swelling or deformity.   Ankle and toes with full ROM without pain. Full strength with resisted to flexion and extension, with no reproduction of pain.    Palpation:  No bony tenderness to palpation over the 5th metatarsal, navicular, and normal Lisfranc joint without swelling or bruising. No tenderness along the length of the metatarsal shafts, or at the metatarsal heads.   Ligamentous Examination:  No Tenderness or ecchymosis over the lateral ligament complex of the ankle. No tenderness over the medial deltoid ligament complex Anterior drawer without anterior slide Talar Tilt appropriate No Tenderness over the Anterior joint line No pain with external rotation ('High ankle') testing.   Normal distal sensation  Imaging: Limited Extremity Ultrasound- Left Foot:  Navicular, cuboids, and first and second metatarsal shafts were easily visualized under ultrasound guidance with no evidence of acute stress reaction.  Extensor hallucis longus tendon visualized in long and short access with no significant thickening or surrounding fluid.   Impression: No evidence of acute stress reaction in left foot  Assessment & Plan: 74 year old female presenting to clinic today with concerns of left foot pain for approximately 1 week.  Patient confesses to wearing a weighted vest while walking which is new to her, and did not decrease the distance of her walks when she got her vest.  In addition to this, she more than doubled the distance of her usual daily walks on the  day when her foot pain started.  Reassuringly, patient has no pain on examination today and states that she has not had any pain since the day of her very long walk. -Reassurance given that at this time I do not see any evidence for an acute stress reaction to suggest a return of her previous stress fracture. -Discussed the importance of slowly increasing her mileage when she changes in aspect of her exercise (i.e. adding a weighted vest).  When she returns to walking now she should reduce her mileage by 50%, and then slowly increase by 10 %/week while wearing her vest to assure that she does not cause stress reaction. -If patient has recurrence of her pain as she resumes activity she is to promptly return to clinic for reevaluation. -Encouraged to continue wear of her Hoka shoes, or other cushioned and supportive shoes, especially in the setting of increasing the weight on her body as she exercises.  -Patient has no further questions today, and will return to clinic in the future if her symptoms deem it necessary.    Patient seen and evaluated with the sports medicine fellow.  I agree with the above plan of care.  Ultrasound today does not show any evidence of stress reaction or stress fracture.  Treatment as above and follow-up as needed.

## 2020-04-27 ENCOUNTER — Other Ambulatory Visit: Payer: Self-pay | Admitting: Family Medicine

## 2020-04-27 DIAGNOSIS — F419 Anxiety disorder, unspecified: Secondary | ICD-10-CM

## 2020-04-27 NOTE — Telephone Encounter (Signed)
Patient is requesting a refill on her escitalopram 10 MG. Last office visit was on 01/05/2020 and next appointment is on 01/08/2021.

## 2020-05-26 ENCOUNTER — Other Ambulatory Visit: Payer: Self-pay | Admitting: Family Medicine

## 2020-05-26 DIAGNOSIS — R059 Cough, unspecified: Secondary | ICD-10-CM

## 2020-06-20 ENCOUNTER — Encounter: Payer: Self-pay | Admitting: Sports Medicine

## 2020-06-20 ENCOUNTER — Ambulatory Visit: Payer: Medicare PPO | Admitting: Sports Medicine

## 2020-06-20 ENCOUNTER — Other Ambulatory Visit: Payer: Self-pay

## 2020-06-20 VITALS — BP 136/70 | Ht 60.0 in | Wt 102.0 lb

## 2020-06-20 DIAGNOSIS — M8430XA Stress fracture, unspecified site, initial encounter for fracture: Secondary | ICD-10-CM

## 2020-06-20 NOTE — Patient Instructions (Signed)
Thank you for coming in to see [place] today!  You have been diagnosed with a stress reaction of your fourth metatarsal, which is about in your foot.  Please see below to review our plan for today's visit:   1.   Please reduce your weekly walking mileage by about 50% for the next 1 to 2 weeks, and then increase by 10 %/week as pain allows. 2.   Please wear well cushioned supportive shoes as much as possible. 3.   Please plan to follow-up with [place] in 2 weeks for reevaluation, sooner if worsening pain or concerns.   Please call the clinic at [phone] if your symptoms worsen or you have any concerns. It was our pleasure to serve you.       Dr. Dagoberto Ligas Dr. [facility]

## 2020-06-20 NOTE — Progress Notes (Signed)
PCP: Girtha Rm, NP-C  Subjective:   HPI: Patient is a 74 y.o. female avid walker here for evaluation of left foot pain.  She reports that about 2 weeks ago, while she was walking she noticed a sudden sharp pain in the dorsal aspect of her lateral foot.  It hurt for a while while she was walking, but seemed to resolve after the walk and she did not think much of it until earlier this week when she had the same sharp pain in the same area of her foot, however this time it was fairly painful for the rest of the walk which she cut short.  After resting, she has not had significant pain and does not have pain with walking today.  She locates the pain on the dorsal and plantar aspect of her foot overlying the fourth metatarsal.  She has not noticed any swelling, bruising.  She did not injure or fall on her ankle.  She has not tried any treatments thus far for this.  She denies any new shoes (she has been wearing Hoka's for a long time), no increase in her walking mileage.  She does note that she has not been intentionally losing weight but has had a lot of anxiety lately and has lost a couple pounds due to lack of appetite.  Review of Systems:  Per HPI.   [place], medications and smoking status reviewed.      Objective:  Physical Exam:  [place] Adult Exercise 03/30/2020 06/20/2020  Frequency of aerobic exercise (# of days/week) 7 6  Average time in minutes 80 65  Frequency of strengthening activities (# of days/week) 3 2     Gen: awake, alert, NAD, comfortable in exam room Pulm: breathing unlabored  Left foot: Inspection:  No obvious bony deformity.  No swelling, erythema, or bruising.  Cavus arch Palpation: She does have tenderness to palpation at the midshaft of the fourth metatarsal, more significant on the dorsal side but also on the plantar aspect.  Otherwise nontender. ROM: Full  ROM of the ankle. Normal midfoot flexibility Strength: 5/5 strength ankle in all  planes Neurovascular: N/V intact distally in the lower extremity Special tests: Negative anterior drawer. Negative squeeze. normal midfoot flexibility. Normal calcaneal motion with heel raise.  Limited ultrasound of the left foot: Ultrasound of the left fourth metatarsal was significant for a small area of fluid overlying the proximal aspect of the fourth metatarsal with increased vascularity on Doppler.  There is no discrete fracture identified.    Assessment & Plan:  1.  Stress reaction of left fourth metatarsal  Patient was signs symptoms most consistent with stress reaction.  She does not have a clear fracture on ultrasound however she does have fluid and increased vascularity surrounding the area of pain which is suspicious for stress reaction.  Plan: -Decrease walking volume by 50% for the next 2 weeks, then increase by 10 %/week as able based on pain. -Wear cushioned shoes as much as possible -Make sure to have adequate calorie intake is essential for bone healing -Follow-up in 2 weeks for reevaluation   Dagoberto Ligas, MD [facility] Fellow 06/20/2020 11:34 AM   Patient seen and evaluated with the sports medicine fellow.  I agree with the above plan of care.  Ultrasound was nondiagnostic but there is evidence of some cortical irregularity and a fluid At the proximal fourth metatarsal.  Given her history of previous metatarsal stress fractures I think that this is likely  an early stress injury to the bone.  Proceed with treatment as above and follow-up with [place] again in 2 weeks for reevaluation and repeat ultrasound.

## 2020-07-04 ENCOUNTER — Ambulatory Visit: Payer: Medicare PPO | Admitting: Sports Medicine

## 2020-07-04 ENCOUNTER — Other Ambulatory Visit: Payer: Self-pay

## 2020-07-04 VITALS — BP 122/82 | Ht 60.0 in | Wt 102.0 lb

## 2020-07-04 DIAGNOSIS — M8430XA Stress fracture, unspecified site, initial encounter for fracture: Secondary | ICD-10-CM

## 2020-07-04 NOTE — Patient Instructions (Signed)
°  Your stress fracture looks like it has nearly healed. I recommend that you return to walking at 50% of your previous weekly volume.  You may then increase 10 %/week as long as your foot pain does not return. Iver Nestle is the nutritionist that I recommend.

## 2020-07-04 NOTE — Progress Notes (Signed)
   Subjective:    Patient ID: Lacey Burke, female    DOB: 1945-08-12, 74 y.o.   MRN: [id number]  HPI   Patient comes in today for follow-up on a stress reaction in the fourth metatarsal of her left foot.  She is doing well.  No pain.  She is anxious to return to her daily walks for exercise.   Review of Systems As above    Objective:   Physical Exam  Well-developed, well-nourished.  No acute distress  Left foot: No soft tissue swelling.  No tenderness to palpation.  Negative metatarsal squeeze.  Good pulses.  Walking without a limp.  Limited MSK ultrasound over the proximal fourth metatarsal shows early bony callus formation.  Previous fluid cap has resolved.      Assessment & Plan:   Improved left foot pain likely secondary to fourth metatarsal stress reaction  Patient may start to increase her walking for exercise.  She will resume at 50% of her previous weekly volume and increase 10 %/week as long as she does not have returning foot pain.  She understands the importance of calcium and vitamin D in her diet.  I provided her with the name of Iver Nestle in case she would like a nutritional consultation.  Follow-up for ongoing or recalcitrant issues.

## 2020-08-27 ENCOUNTER — Other Ambulatory Visit: Payer: Self-pay | Admitting: Family Medicine

## 2020-08-27 DIAGNOSIS — R059 Cough, unspecified: Secondary | ICD-10-CM

## 2020-10-16 DIAGNOSIS — H26493 Other secondary cataract, bilateral: Secondary | ICD-10-CM | POA: Diagnosis not present

## 2020-10-16 DIAGNOSIS — H524 Presbyopia: Secondary | ICD-10-CM | POA: Diagnosis not present

## 2020-10-16 DIAGNOSIS — Z961 Presence of intraocular lens: Secondary | ICD-10-CM | POA: Diagnosis not present

## 2020-10-25 ENCOUNTER — Other Ambulatory Visit: Payer: Self-pay | Admitting: Family Medicine

## 2020-10-25 DIAGNOSIS — F419 Anxiety disorder, unspecified: Secondary | ICD-10-CM

## 2020-10-25 NOTE — Telephone Encounter (Signed)
Pt has an appt in june

## 2020-10-31 DIAGNOSIS — Z1231 Encounter for screening mammogram for malignant neoplasm of breast: Secondary | ICD-10-CM | POA: Diagnosis not present

## 2020-11-08 DIAGNOSIS — H26491 Other secondary cataract, right eye: Secondary | ICD-10-CM | POA: Diagnosis not present

## 2020-11-08 DIAGNOSIS — H26493 Other secondary cataract, bilateral: Secondary | ICD-10-CM | POA: Diagnosis not present

## 2020-11-15 DIAGNOSIS — H26492 Other secondary cataract, left eye: Secondary | ICD-10-CM | POA: Diagnosis not present

## 2020-11-25 ENCOUNTER — Other Ambulatory Visit: Payer: Self-pay | Admitting: Family Medicine

## 2020-11-25 DIAGNOSIS — R059 Cough, unspecified: Secondary | ICD-10-CM

## 2021-01-08 ENCOUNTER — Ambulatory Visit: Payer: Medicare PPO | Admitting: Family Medicine

## 2021-01-10 DIAGNOSIS — Z20822 Contact with and (suspected) exposure to covid-19: Secondary | ICD-10-CM | POA: Diagnosis not present

## 2021-02-01 ENCOUNTER — Other Ambulatory Visit: Payer: Self-pay | Admitting: Family Medicine

## 2021-02-01 DIAGNOSIS — F419 Anxiety disorder, unspecified: Secondary | ICD-10-CM

## 2021-02-27 ENCOUNTER — Other Ambulatory Visit: Payer: Self-pay | Admitting: Family Medicine

## 2021-02-27 DIAGNOSIS — R059 Cough, unspecified: Secondary | ICD-10-CM

## 2021-04-03 DIAGNOSIS — Z1211 Encounter for screening for malignant neoplasm of colon: Secondary | ICD-10-CM | POA: Diagnosis not present

## 2021-04-03 DIAGNOSIS — Z7689 Persons encountering health services in other specified circumstances: Secondary | ICD-10-CM | POA: Diagnosis not present

## 2021-04-03 DIAGNOSIS — F411 Generalized anxiety disorder: Secondary | ICD-10-CM | POA: Diagnosis not present

## 2021-04-03 DIAGNOSIS — M810 Age-related osteoporosis without current pathological fracture: Secondary | ICD-10-CM | POA: Diagnosis not present

## 2021-04-03 DIAGNOSIS — I10 Essential (primary) hypertension: Secondary | ICD-10-CM | POA: Diagnosis not present

## 2021-04-03 DIAGNOSIS — Z23 Encounter for immunization: Secondary | ICD-10-CM | POA: Diagnosis not present

## 2021-04-16 DIAGNOSIS — Z1211 Encounter for screening for malignant neoplasm of colon: Secondary | ICD-10-CM | POA: Diagnosis not present

## 2021-04-19 LAB — EXTERNAL GENERIC LAB PROCEDURE: COLOGUARD: NEGATIVE

## 2021-04-19 LAB — COLOGUARD: COLOGUARD: NEGATIVE

## 2021-05-30 ENCOUNTER — Ambulatory Visit: Payer: Medicare PPO | Admitting: Family Medicine

## 2021-08-15 ENCOUNTER — Ambulatory Visit: Payer: Medicare PPO | Admitting: Physician Assistant

## 2021-08-15 ENCOUNTER — Other Ambulatory Visit: Payer: Self-pay

## 2021-08-15 ENCOUNTER — Encounter: Payer: Self-pay | Admitting: Physician Assistant

## 2021-08-15 DIAGNOSIS — Z1283 Encounter for screening for malignant neoplasm of skin: Secondary | ICD-10-CM

## 2021-08-15 DIAGNOSIS — B079 Viral wart, unspecified: Secondary | ICD-10-CM | POA: Diagnosis not present

## 2021-08-15 DIAGNOSIS — Z86018 Personal history of other benign neoplasm: Secondary | ICD-10-CM | POA: Diagnosis not present

## 2021-08-16 ENCOUNTER — Encounter: Payer: Self-pay | Admitting: Physician Assistant

## 2021-08-16 NOTE — Progress Notes (Signed)
° °  Follow-Up Visit   Subjective  Lacey Burke is a 76 y.o. female who presents for the following: Annual Exam (No new concerns.- right bottom of foot x 6 months- ? Wart - filing down with nail file. No family history melanoma or non mole skin cancers. No personal history of melanoma or non mole skin cancers but has had atypical nevi. ).   The following portions of the chart were reviewed this encounter and updated as appropriate:  Tobacco   Allergies   Meds   Problems   Med Hx   Surg Hx   Fam Hx       Objective  Well appearing patient in no apparent distress; mood and affect are within normal limits.  A full examination was performed including scalp, head, eyes, ears, nose, lips, neck, chest, axillae, abdomen, back, buttocks, bilateral upper extremities, bilateral lower extremities, hands, feet, fingers, toes, fingernails, and toenails. All findings within normal limits unless otherwise noted below.  Right Middle Plantar Surface Verrucous papules -- Discussed viral etiology and contagion.   Head to Toe No atypical nevi or signs of NMSC noted at the time of the visit.    Assessment & Plan  Viral warts, unspecified type Right Middle Plantar Surface  Home [place]  Encounter for screening for malignant neoplasm of skin Head to Toe  Yearly skin check   No atypical nevi noted at the time of the visit.  I, Kaemon Barnett, PA-C, have reviewed all documentation's for this visit.  The documentation on 08/16/21 for the exam, diagnosis, procedures and orders are all accurate and complete.

## 2022-03-05 ENCOUNTER — Ambulatory Visit: Payer: Medicare PPO | Admitting: Sports Medicine

## 2022-03-05 DIAGNOSIS — M25511 Pain in right shoulder: Secondary | ICD-10-CM

## 2022-03-05 NOTE — Progress Notes (Unsigned)
    SUBJECTIVE:   CHIEF COMPLAINT / HPI:   Right shoulder injury Lacey Burke is a pleasant 76 year old woman who presents today for an initial evaluation of her right shoulder.  She has been seen here previously for other complaints.  She reports that sometime in late July, she was doing her normal upper body weights routine and noticed anterior shoulder pain; she lifts an 8 pound weight with a bicep curl followed by arm extension.  The next morning, she noticed consistent pain over the anterior shoulder and found it hard to lift her arm above 90 degrees or reach back to unhook her bra.  Denies swelling, bruising, or knots and muscle. The pain can be sharp, originating over the anterior shoulder and radiating downward to the elbow along the bicipital groove.  This pain sometimes wakes her from sleep.  She was seen for this at a sports medicine walk-in clinic August 3 in [place].  Unfortunately, our system does not communicate with theirs.  Patient reports she underwent x-rays where they told her she had a little bit of arthritis.  They recommended reducing weights, range of motion stretching, and Aleve.  Patient reports pain despite these conservative measures.  PERTINENT  PMH / PSH: Osteoporosis, left metatarsal stress fracture, hypertension, trigeminal neuralgia, insomnia, anxious mood  OBJECTIVE:   BP (!) 123/52   Ht 5' (1.524 m)   Wt 100 lb (45.4 kg)   BMI 19.53 kg/m    Right shoulder Inspection reveals right shoulder equal to left, no obvious deformity or swelling TTP over right superior bicep insertion, otherwise nontender to palpation Neurovascularly intact, sensation equal bilaterally Grip strength, shoulder abduction, and push/pull 5/5 and equal bilaterally ROM limited by pain, but patient is able to raise right arm above 90* Internal and external rotation strong 5/5 and equal Cross body, Neer's, and Hawkins slightly uncomfortable but negative Empty can negative, slightly  uncomfortable but able to perform, strength intact and equal O'Brien's negative   ASSESSMENT/PLAN:   Pain in joint of right shoulder Original differential includes rotator cuff tear versus biceps tendinitis; however physical exam reassuring that rotator cuff intact.  Most likely biceps tendinitis.  Will schedule patient close follow-up for complete shoulder ultrasound, hopefully early next week.  Until then, recommend adjusting exercises as necessary and continuing Aleve.  Patient amenable to plan.     Lacey Essex, MD [place]   Patient seen and evaluated with the resident.  I agree with the above plan of care.  Patient's point of maximum tenderness is over the bicipital groove.  Although she does have some limited range of motion with active abduction her rotator cuff strength is good.  She will return to the office next week for a complete ultrasound of her right shoulder.  In the meantime, she will modify her exercise routine, especially her strength training.  I recommended that she substitute resistance bands for weights the time being.

## 2022-03-05 NOTE — Assessment & Plan Note (Signed)
Original differential includes rotator cuff tear versus biceps tendinitis; however physical exam reassuring that rotator cuff intact.  Most likely biceps tendinitis.  Will schedule patient close follow-up for complete shoulder ultrasound, hopefully early next week.  Until then, recommend adjusting exercises as necessary and continuing Aleve.  Patient amenable to plan.

## 2022-03-08 ENCOUNTER — Encounter: Payer: Self-pay | Admitting: Physician Assistant

## 2022-03-12 ENCOUNTER — Ambulatory Visit: Payer: Self-pay

## 2022-03-12 ENCOUNTER — Ambulatory Visit: Payer: Medicare PPO | Admitting: Sports Medicine

## 2022-03-12 VITALS — BP 130/56 | Ht 60.0 in

## 2022-03-12 DIAGNOSIS — M25511 Pain in right shoulder: Secondary | ICD-10-CM

## 2022-03-12 MED ORDER — NITROGLYCERIN 0.2 MG/HR TD PT24: MEDICATED_PATCH | TRANSDERMAL | 1 refills | Status: DC

## 2022-03-12 NOTE — Patient Instructions (Signed)

## 2022-03-13 NOTE — Progress Notes (Addendum)
Patient ID: Lacey Burke, female   DOB: 1945/10/01, 76 y.o.   MRN: [id number]  [name] presents today for complete right shoulder ultrasound.  Please see the office note from March 05, 2022 for details regarding history and physical exam findings.  Right shoulder ultrasound:  Biceps tendon was well visualized in both long and short axis.  There is a pocket of fluid around the proximal biceps tendon just distal to the shoulder.  Subtle palpation reproduces pain in this area as well.  There does appear to be a split in the biceps tendon here.  AC joint was well visualized with minimal degenerative changes.  No mushroom sign.  Supraspinatus, infraspinatus, and subscapularis were all well visualized without abnormality.  Glenohumeral joint was also well visualized without abnormality.  Findings are consistent with probable split tear of the proximal biceps tendon.  Her ultrasound findings correlate with her physical exam.  We will treat her proximal biceps tendon injury with topical nitroglycerin.  She will utilize a quarter patch daily.  She will also start a home exercise program and will refrain from any exercises in the gym that reproduce her pain.  She will follow-up with me again in 4 to 6 weeks for reevaluation.  We will plan on doing a limited ultrasound of her biceps tendon at that time.  If symptoms are not improving then consider merits of further diagnostic imaging.  Call with questions or concerns in the interim.  This note was dictated using Dragon naturally speaking software and may contain errors in syntax, spelling, or content which have not been identified prior to signing this note.

## 2022-04-09 ENCOUNTER — Ambulatory Visit: Payer: Medicare PPO | Admitting: Sports Medicine

## 2022-04-11 ENCOUNTER — Ambulatory Visit: Payer: Medicare PPO | Admitting: Sports Medicine

## 2022-04-11 ENCOUNTER — Ambulatory Visit: Payer: Self-pay

## 2022-04-11 VITALS — BP 104/60

## 2022-04-11 DIAGNOSIS — M25511 Pain in right shoulder: Secondary | ICD-10-CM

## 2022-04-11 DIAGNOSIS — M25561 Pain in right knee: Secondary | ICD-10-CM | POA: Diagnosis not present

## 2022-04-11 NOTE — Patient Instructions (Signed)
Shellia Cleverly, Physical Therapist, [employer] [phone]

## 2022-04-11 NOTE — Progress Notes (Addendum)
Lacey Burke - 76 y.o. female MRN [id number]  Date of birth: 1945-11-23    CHIEF COMPLAINT:   R shoulder pain & R knee pain    SUBJECTIVE:   HPI:  Pleasant 76 year old female here for follow-up of right shoulder pain and new complaint of right knee pain.  In regards to her right shoulder, she was previously evaluated here on 8/22 and 8/29 diagnosed with proximal biceps tendinitis.  Her complete rash ultrasound showed findings consistent with a probable split tear of the proximal biceps tendon.  Over the last month she is try to treat this conservatively.  She tried a few topical nitroglycerin patches but they irritated her skin so she did not continue with those.  She did some home exercises but admits she was not super diligent about doing these daily.  She has not tried any oral anti-inflammatories.  She has tried heat and ice.  Overall, her progress has been minimal.  She was hoping that she would feel better by now.  In regards to her right knee, she has had about 1 month of right knee pain.  She has not started about same time the shoulder started hurting but it was not as bad so she did not mention it.  She describes a dull intermittent ache in the posterior aspect of the knee.  It is worse when she is lying down at night.  She denies any inciting injury or trauma.  She has not tried any specific therapies for this.  ROS:     See HPI  PERTINENT  PMH / PSH FH / / SH:  Past Medical, Surgical, Social, and Family History Reviewed & Updated in the EMR.  Pertinent findings include:  none  OBJECTIVE: BP 104/60   Physical Exam:  Vital signs are reviewed.  GEN: Alert and oriented, NAD Pulm: Breathing unlabored PSY: normal mood, congruent affect  MSK: R Shoulder -tender to palpation at bicipital groove.  Nontender at [facility] joint.  Full range of motion of the shoulder in all directions.  5/5 strength throughout.  Positive speeds test that elicits pain.  Positive Yergason's Test that elicits  pain.  R Knee -no obvious effusion or overlying erythema.  She is nontender to palpation at medial and lateral joint line.  Full range of motion.  5/5 strength.  No ligamentous instability with valgus or varus stressing.  Negative Lachman's.  Negative Thessaly test.   ULTRASOUND Right Knee MSK ultrasound knee: Images were obtained both in the transverse and longitudinal plane. Patellar and quadriceps tendons were well visualized with no abnormalities. Minimal effusion suprapatellar pouch. Medial and lateral menisci were well visualized with no abnormalities.  Bone spurring noted at both the medial lateral joint lines. Popliteal fossa was evaluated in both the transverse and longitudinal planes. No obvious Baker's cyst  Impression: Bone spurring of the medial lateral joint lines consistent with mild arthritis   ASSESSMENT & PLAN:  1.  Right proximal biceps tendinitis  -Although the patient has not made progress at the rate that she would like, she is not interested in pursuing any more diagnostic modalities such as MRI at this time.  She is not interested in corticosteroid injection around the biceps either.  She would like to continue to treat this conservatively with formal physical therapy and continuing her home exercises and oral anti-inflammatories as needed.  We will have her follow-up in 4 to 6 weeks.  If no improvement at that time, would get an MRI and continue to contemplate surgical  intervention such as biceps tenodesis, although she would like to avoid surgery if at all possible.  2. Right Knee Pain likely secondary to OA -Patient's right knee pain is likely secondary to osteoarthritis, as evident by the bone spurring seen on her ultrasound of both the lateral and medial joint lines.  She did not have any obvious effusion, meniscus tear, or Baker's cyst.  She would also like to treat this conservatively with formal physical therapy and would like to avoid a corticosteroid injection  if possible.  Have her follow back up in 4 to 6 weeks.  If no improvement, she may benefit from a course steroid injection at that time.  Dortha Kern, MD PGY-4, Sports Medicine Fellow [place]  Patient seen and evaluated with the sports medicine fellow.  I agree with the above plan of care.  Are going to try some physical therapy for both the right proximal biceps tendinitis as well as her right knee pain which is likely secondary to DJD.  Symptoms in the right shoulder are not severe enough that she wants to consider MRI at this time but if she changes her mind she will contact me.  If her right knee pain persist despite starting physical therapy then we could consider a cortisone injection.  I would like for her to follow-up with me again in approximately 4 weeks after starting physical therapy.  Patient agrees with this plan.  This note was dictated using Dragon naturally speaking software and may contain errors in syntax, spelling, or content which have not been identified prior to signing this note.

## 2022-07-18 ENCOUNTER — Ambulatory Visit
Admission: RE | Admit: 2022-07-18 | Discharge: 2022-07-18 | Disposition: A | Discharge status: Home / Self Care | Payer: Medicare PPO | Source: Ambulatory Visit | Attending: Sports Medicine | Admitting: Sports Medicine

## 2022-07-18 ENCOUNTER — Ambulatory Visit: Payer: Medicare PPO | Admitting: Sports Medicine

## 2022-07-18 ENCOUNTER — Ambulatory Visit: Payer: Self-pay

## 2022-07-18 VITALS — BP 114/76 | Ht 59.75 in | Wt 99.0 lb

## 2022-07-18 DIAGNOSIS — M25511 Pain in right shoulder: Secondary | ICD-10-CM

## 2022-07-18 DIAGNOSIS — M25512 Pain in left shoulder: Secondary | ICD-10-CM

## 2022-07-18 NOTE — Progress Notes (Addendum)
Lacey Burke - 77 y.o. female MRN [id number]  Date of birth: [birth year]    CHIEF COMPLAINT:   Bilateral shoulder pain    SUBJECTIVE:   HPI:  Pleasant 77 year old female comes to clinic to be evaluated for bilateral shoulder pain.  Right shoulder -known history of long head of biceps tendinitis.  Diagnosed on ultrasound back in September 2023.  She previously tried topical nitroglycerin patches and doing formal physical therapy for this.  She says its gotten better to a certain degree but is not 100% yet.  It has unfortunately kept her from doing some of the activities she enjoys, most notably swing dancing.  She has now been undergoing treatment for this for over 4 months and not gotten any improvement she is hoping for.  Left shoulder -within the last several weeks she started developing pain in the left shoulder that is similar to her right shoulder.  She now reports dull aching pain that is in the anterior part of the left shoulder.  The pain does not radiate.  The left shoulder is now actually more symptomatic than the right side.  She is right-hand dominant so she is not sure what she has been doing to cause the left shoulder to become irritated.  She has not tried any formal physical therapy for this or tried nitroglycerin patches on it.  No numbness or tingling down the arm.  ROS:     See HPI  PERTINENT  PMH / PSH FH / / SH:  Past Medical, Surgical, Social, and Family History Reviewed & Updated in the EMR.  Pertinent findings include:  none  OBJECTIVE: BP 114/76   Ht 4' 11.75" (1.518 m)   Wt 99 lb (44.9 kg)   BMI 19.50 kg/m   Physical Exam:  Vital signs are reviewed.  GEN: Alert and oriented, NAD Pulm: Breathing unlabored PSY: normal mood, congruent affect  MSK: Right shoulder -no obvious deformity.  Nontender to palpation of the clavicle, AC joint.  She is tender to palpation over the biceps tendon in the bicipital groove.  She has full range of motion of shoulder in all  directions.  5/5 strength throughout.  Negative Hawkins test.  Negative Neer's test.  Negative empty can test.  Positive speeds test.  Negative Yergason's Test.  Neurovascularly intact distally.  Left shoulder -no obvious deformity.  Nontender to palpation of the clavicle, AC joint.  She is tender to palpation over the biceps tendon in the bicipital groove.  She has full range of motion of shoulder in all directions.  5/5 strength with resisted external rotation.  4/5 strength with resisted internal rotation. Negative Hawkins test.  Negative Neer's test.  Negative empty can test.  Positive speeds test.  Negative Yergason's Test.  Neurovascularly intact distally.  Ultrasound: ULTRASOUND: Shoulder, Right  Diagnostic limited ultrasound imaging obtained of patient's right  shoulder.  - Long head of the biceps tendon: There is a hypoechoic fluid collection in the mid substance of the biceps tendon seen in the long axis view.  There is neovascularization seen on Doppler over this area.  There is a positive bull's-eye sign in the short axis view.  Impression: Partial-thickness split tear of proximal biceps tendon  ULTRASOUND: Shoulder, Left Diagnostic limited ultrasound imaging obtained of patient's right  shoulder.  - Long head of the biceps tendon: There is a hypoechoic fluid collection along the superficial aspect of the biceps tendon seen in the long axis view.  There is small neovascularization seen on Doppler over  this area.  There is no bull's-eye sign in the short axis view.  Impression: probable small split tear of the proximal biceps tendon.  ASSESSMENT & PLAN:  1.  Bilateral proximal biceps tendinitis  -Discussed with the patient and her findings on ultrasound examination today.  Given the shorter duration of symptoms on the left, in combination with the less prominent findings on the ultrasound scan, I think it is reasonable to start by treating the left biceps conservatively with formal  physical therapy and over-the-counter oral anti-inflammatories.  Especially since she responded well to physical therapy initially with the right shoulder.  In regards to the right shoulder, she has had some improvement but still having some difficulties there.  Her ultrasound scan today still shows partial-thickness tear with neovascularization there.  We are going to proceed with obtaining an x-ray and ultimately an MRI of her right shoulder to further evaluate the integrity of her biceps tendon.  We did discuss that arthroscopic surgery could be an option for her depending on what the MRI shows.  She would like to give this some more thought, but ultimately her goal is to stay active.  Arthroscopic surgery may be the best avenue for her to stay active.  Will follow-up with her after MRI results.  Dortha Kern, MD PGY-4, Sports Medicine Fellow [place]  Patient seen and evaluated with the sports medicine fellow.  I agree with the above plan of care.  I recommended an MRI of the right shoulder and I will follow-up with her with those results when available.  For her left shoulder, she is likely dealing with similar symptoms that she has on the right although the ultrasound does not show severe changes in the proximal biceps tendon.  I would like to try a course of physical therapy for this.  We will see how she is responding to PT when I follow-up with her with her MRI results.  This note was dictated using Dragon naturally speaking software and may contain errors in syntax, spelling, or content which have not been identified prior to signing this note.

## 2022-07-23 ENCOUNTER — Encounter: Payer: Self-pay | Admitting: Sports Medicine

## 2022-08-04 ENCOUNTER — Ambulatory Visit
Admission: RE | Admit: 2022-08-04 | Discharge: 2022-08-04 | Disposition: A | Discharge status: Home / Self Care | Payer: Medicare PPO | Source: Ambulatory Visit | Attending: Sports Medicine | Admitting: Sports Medicine

## 2022-08-04 DIAGNOSIS — M25511 Pain in right shoulder: Secondary | ICD-10-CM

## 2022-08-09 ENCOUNTER — Encounter: Payer: Self-pay | Admitting: Sports Medicine

## 2022-12-17 ENCOUNTER — Ambulatory Visit: Payer: Medicare PPO | Admitting: Sports Medicine

## 2022-12-17 VITALS — BP 110/70 | Ht 60.0 in | Wt 95.0 lb

## 2022-12-17 DIAGNOSIS — M25561 Pain in right knee: Secondary | ICD-10-CM

## 2022-12-17 NOTE — Progress Notes (Signed)
   Subjective:    Patient ID: Lacey Burke, female    DOB: Nov 02, 1945, 77 y.o.   MRN: [id number]  HPI chief complaint: Right knee pain  Lacey Burke presents today complaining of right knee pain that began acutely 1 week ago when performing a lunge.  Shortly after that she experienced some pain along the anterior lateral knee.  Although she did not notice any swelling, she did note an inability to completely flex her right knee like she can on the left.  Her symptoms are slowly improving.  She also endorses some instability when navigating stairs, primarily due to her pain.  She tried a Velcro type compression around the knee but this caused some ankle swelling so she discontinued it.  She has had some issues with this knee in the past.  Previous bedside ultrasound showed spurring consistent with osteoarthritis.    Review of Systems As above    Objective:   Physical Exam  Well-developed, well-nourished.  No acute distress  Right knee: Range of motion is 0 to 140 degrees compared to 0 to 150 degrees on the left.  Trace effusion noted.  There is no tenderness to palpation along medial or lateral joint lines.  No tenderness to palpation along the distal quadriceps tendon.  Knees are stable to valgus and varus stressing.  Negative Thessaly's.  Slight genu valgus with standing.  Neurovascularly intact distally.  Walking without a limp.  Limited bedside ultrasound of the right knee shows a tiny knee effusion.  There is spurring of the lateral joint space consistent with osteoarthritis.  Medial joint space shows only mild degenerative changes.      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  We will try a compression sleeve with exercise.  She will avoid those exercises which cause her pain such as lunges.  She will start a home exercise program consisting of isometric quad strengthening and hip abductor strengthening.  If symptoms do not continue to improve  then I would start with getting an x-ray of her right knee.  We also discussed the possibility of cortisone injection if symptoms do not resolve (she has an [place] trip planned in a few weeks).  Follow-up for ongoing or recalcitrant issues.  This note was dictated using Dragon naturally speaking software and may contain errors in syntax, spelling, or content which have not been identified prior to signing this note.

## 2023-09-01 ENCOUNTER — Ambulatory Visit: Payer: Medicare PPO | Admitting: Family Medicine

## 2023-09-01 VITALS — BP 180/106 | Ht 59.0 in | Wt 93.0 lb

## 2023-09-01 DIAGNOSIS — M25561 Pain in right knee: Secondary | ICD-10-CM | POA: Diagnosis not present

## 2023-09-01 DIAGNOSIS — G8929 Other chronic pain: Secondary | ICD-10-CM | POA: Diagnosis not present

## 2023-09-01 NOTE — Progress Notes (Unsigned)
[place] Sleeve/knee, worse bc knee buckled   CHIEF COMPLAINT: No chief complaint on file.  _____________________________________________________________ SUBJECTIVE  HPI  Pt is a 78 y.o. female here for evaluation of  R knee pain Ongoing for *** Inciting event: Exacerbated Saturday doing yoga Primarily located: suprapatella and patellar tendon, posterior  Radiating: localized Numbness/tingling: none Catching/locking *** Exacerbated by Therapies tried so UJW:[id number]  Gave way R knee, couple of times [employer], walks, dances  Works as Sport***   ------------------------------------------------------------------------------------------------------ Past Medical History:  Diagnosis Date   Anxiety    Atypical mole 04/06/1997   slight to moderate atypia on right foot (instep)   Blood transfusion without reported diagnosis 07/15/1980   SAB at second trimester with IUD; required transfusion.   Cataract    GERD (gastroesophageal reflux disease)    Heart murmur    childhood onset.   Hypertension 07/15/1998   Osteoporosis    Seasonal allergies    Zyrtec daily.   Trigeminal neuralgia 07/15/1996   s/p surgical procedure 05/15/2013 [facility].    Past Surgical History:  Procedure Laterality Date   ABDOMINAL HYSTERECTOMY  2007   Fibroids; ovaries resected.   BRAIN SURGERY  05/15/2013   surgical procedure for trigeminal neuralgia. [facility]   BREAST SURGERY     Augmentation in 1970's/Implant removal 1990's   COLONOSCOPY  02/08/2013   normal.  Repeat in 10 years.  [address]   COSMETIC SURGERY     EYE SURGERY     HAND SURGERY Left august 2014   NOSE SURGERY  07/15/1978   Surgical procedure for trigeminal neuralgia  05/15/2013   [facility]   TONSILECTOMY, ADENOIDECTOMY, BILATERAL MYRINGOTOMY AND TUBES        Outpatient Encounter Medications as of 09/01/2023  Medication Sig   Acetylcysteine 500 MG CAPS Take by mouth.   alendronate (FOSAMAX) 70 MG tablet Take 1 tablet by  mouth once a week.   amLODipine (NORVASC) 5 MG tablet Take 1 tablet by mouth once daily (Patient taking differently: Take 10 mg by mouth daily.)   B Complex Vitamins (B COMPLEX 50) TABS    Calcium-Mag-Vit C-Vit D 185-28-2.5-200 CAPS Take by mouth.   Cetirizine HCl (ZYRTEC PO) Take by mouth.   Cholecalciferol 25 MCG (1000 UT) capsule    Coenzyme Q10 (COQ10 PO) Take 300 mg by mouth.   COLLAGEN-VITAMIN C PO Take 2,500 mg by mouth.   escitalopram (LEXAPRO) 10 MG tablet TAKE 1 TABLET BY MOUTH AT BEDTIME   Misc Natural Products (RESVERATROL ULTRA) CAPS    Multiple Vitamin (MULTI-VITAMIN) tablet Take by mouth.   Multiple Vitamins-Minerals (ICAPS PO) Take by mouth.   nitroGLYCERIN (NITRODUR - DOSED IN MG/24 HR) 0.2 mg/hr patch Use 1/4 patch daily to the affected area   OVER THE COUNTER MEDICATION Delta-8 gummies   polyethylene glycol (MIRALAX / GLYCOLAX) packet Take 17 g by mouth daily.   Psyllium (METAMUCIL FREE & NATURAL PO)    TURMERIC PO Take 200 mg by mouth.   Facility-Administered Encounter Medications as of 09/01/2023  Medication   EPINEPHrine (EPI-PEN) injection 0.3 mg    ------------------------------------------------------------------------------------------------------  _____________________________________________________________ OBJECTIVE  PHYSICAL EXAM  There were no vitals filed for this visit. There is no height or weight on file to calculate BMI.   reviewed  General: A+Ox3, no acute distress, well-nourished, appropriate affect CV: pulses 2+ regular, nondiaphoretic, no peripheral edema, cap refill <2sec Lungs: no audible wheezing, non-labored breathing, bilateral chest rise/fall, nontachypneic Skin: warm, well-perfused, non-icteric, no susp lesions or rashes  Neuro: *** Sensation intact, muscle tone wnl, no atrophy Psych: no signs of depression or anxiety MSK:  *** Knee: No swelling or deformity Neg fluid wave, joint milking ROM Flex {Numbers; 0-100:15068}, Ext  {NUMBERS; -10-45 JOINT ROM:10287} NTTP over the quad tendon, medial fem condyle, lat fem condyle, patella, plica, patella tendon, tibial tuberostiy, fibular head, posterior fossa, pes anserine bursa, gerdy's tubercle, medial jt line, lateral jt line Neg anterior and posterior drawer Neg lachman Neg sag sign Negative varus stress Negative valgus stress Negative McMurray Negative Thessaly  Gait {GAIT:23234}   *** Shoulder:  No deformity, swelling or muscle wasting No scapular winging FF 180, abd 180, int 0, ext 90 NTTP over the [place], clavicle, ac, coracoid, biceps groove, humerus, deltoid, subacromial space, scap spine, musculature, trap, cervical spine Neg neer, hawkins, empty can, scarf test, hornblower, resisted anterior flexion, subscap liftoff, speeds, obriens Neg ant drawer, sulcus sign Neg apprehension Negative Spurling's test bilat FROM of neck       _____________________________________________________________ ASSESSMENT/PLAN There are no diagnoses linked to this encounter.   DG knee right  Electronically signed by: Burna Forts, MD 09/01/2023 3:05 PM

## 2023-09-23 ENCOUNTER — Encounter: Payer: Self-pay | Admitting: Sports Medicine

## 2023-09-23 ENCOUNTER — Ambulatory Visit (INDEPENDENT_AMBULATORY_CARE_PROVIDER_SITE_OTHER): Payer: Medicare PPO | Admitting: Sports Medicine

## 2023-09-23 VITALS — BP 106/74 | Ht 59.0 in | Wt 93.0 lb

## 2023-09-23 DIAGNOSIS — M25561 Pain in right knee: Secondary | ICD-10-CM

## 2023-09-23 DIAGNOSIS — M7521 Bicipital tendinitis, right shoulder: Secondary | ICD-10-CM | POA: Diagnosis not present

## 2023-09-23 DIAGNOSIS — M7522 Bicipital tendinitis, left shoulder: Secondary | ICD-10-CM

## 2023-09-23 NOTE — Progress Notes (Signed)
   Subjective:    Patient ID: Lacey Burke, female    DOB: 07-06-1946, 78 y.o.   MRN: [id number]  HPI  Lacey Burke presents today for follow-up on right knee pain and bilateral shoulder pain.  She is attending physical therapy at [facility] in [place].  She does feel like it is helpful although her improvement has been slow.  Initially she was getting pain in the anterior knee but is now experiencing some pain in the posterior knee.  No swelling.  She is having to wear a brace and she has not been able to return to her recreational walking yet.  She also has not been able to return to dancing.  In regards to her shoulders, she is complaining of anterior shoulder pain.  She has a history of bilateral proximal biceps tendinitis in the past.  This has been seen both on MRI and an ultrasound.  Review of Systems As above    Objective:   Physical Exam  Well-developed, fit appearing.  No acute distress  Examination of each shoulder shows good range of motion.  No soft tissue swelling.  She is tender to palpation over the proximal biceps tendon bilaterally.  Right knee: Full range of motion.  No effusion.  2+ patellofemoral crepitus.  No tenderness to palpation.  Good stability.  Neurovascularly intact distally.      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  I have asked the physical therapist to add iontophoresis to her bilateral proximal biceps tendons.  She will follow-up with me again in 4 weeks.  We discussed working out on an stationary bike until she can return to her normal exercise routine including walking and dancing.  I also recommended a trial of Voltaren gel twice daily to the right knee.  This note was dictated using Dragon naturally speaking software and may contain errors in syntax, spelling, or content which have not  been identified prior to signing this note.

## 2023-10-22 ENCOUNTER — Ambulatory Visit: Admitting: Sports Medicine

## 2023-10-22 ENCOUNTER — Encounter: Payer: Self-pay | Admitting: Sports Medicine

## 2023-10-22 VITALS — BP 159/84 | Ht 59.0 in | Wt 93.0 lb

## 2023-10-22 DIAGNOSIS — M7522 Bicipital tendinitis, left shoulder: Secondary | ICD-10-CM

## 2023-10-22 DIAGNOSIS — M25561 Pain in right knee: Secondary | ICD-10-CM | POA: Diagnosis not present

## 2023-10-22 DIAGNOSIS — M7521 Bicipital tendinitis, right shoulder: Secondary | ICD-10-CM | POA: Diagnosis not present

## 2023-10-22 NOTE — Progress Notes (Signed)
   Subjective:    Patient ID: Lacey Burke, female    DOB: 13-Sep-1945, 78 y.o.   MRN: [id number]  HPI  Lacey Burke presents today for follow-up on bilateral shoulder and right knee pain.  Knee is feeling much better.  She has been able to return to some limited recreational walking.  She also purchased a stationary bike and has been riding that.  She still has some anterior shoulder pain but this too is continuing to improve with physical therapy.  Review of Systems     Objective:   Physical Exam  Bilateral shoulders: Excellent range of motion.  She does have some pain in the area of the bicipital groove.  Good strength.  Right knee: Full range of motion.  Good strength.  No tenderness along medial or lateral joint lines.  Negative Thessaly's.      Assessment & Plan:   Bilateral shoulder pain secondary to proximal biceps tendinopathy Improved right knee pain  I am pleased with Lacey Burke's progress to date with physical therapy.  I think she will soon be able to wean to a home exercise program.  She will continue to slowly increase the amount of recreational walking she is doing as she will hopefully be able to return to dance soon as well.  I did recommend a trial of topical Voltaren gel to her shoulders.  She can use that as needed.  She will follow-up with me for ongoing or recalcitrant issues.  This note was dictated using Dragon naturally speaking software and may contain errors in syntax, spelling, or content which have not been identified prior to signing this note.
